# Patient Record
Sex: Female | Born: 1955 | Race: White | Hispanic: No | Marital: Single
Health system: Southern US, Community
[De-identification: ages and names within clinical notes are randomized; demographics above are authoritative.]

## PROBLEM LIST (undated history)

## (undated) DIAGNOSIS — F32A Depression, unspecified: Secondary | ICD-10-CM

## (undated) DIAGNOSIS — R609 Edema, unspecified: Secondary | ICD-10-CM

## (undated) DIAGNOSIS — F329 Major depressive disorder, single episode, unspecified: Secondary | ICD-10-CM

## (undated) DIAGNOSIS — I5022 Chronic systolic (congestive) heart failure: Secondary | ICD-10-CM

## (undated) DIAGNOSIS — J449 Chronic obstructive pulmonary disease, unspecified: Secondary | ICD-10-CM

## (undated) DIAGNOSIS — Z794 Long term (current) use of insulin: Secondary | ICD-10-CM

## (undated) DIAGNOSIS — E119 Type 2 diabetes mellitus without complications: Secondary | ICD-10-CM

## (undated) DIAGNOSIS — IMO0001 Reserved for inherently not codable concepts without codable children: Secondary | ICD-10-CM

## (undated) DIAGNOSIS — G629 Polyneuropathy, unspecified: Secondary | ICD-10-CM

## (undated) DIAGNOSIS — I469 Cardiac arrest, cause unspecified: Secondary | ICD-10-CM

## (undated) DIAGNOSIS — I219 Acute myocardial infarction, unspecified: Secondary | ICD-10-CM

## (undated) DIAGNOSIS — E785 Hyperlipidemia, unspecified: Secondary | ICD-10-CM

## (undated) DIAGNOSIS — K219 Gastro-esophageal reflux disease without esophagitis: Secondary | ICD-10-CM

## (undated) DIAGNOSIS — R42 Dizziness and giddiness: Secondary | ICD-10-CM

## (undated) DIAGNOSIS — I10 Essential (primary) hypertension: Secondary | ICD-10-CM

## (undated) HISTORY — PX: OTHER SURGICAL HISTORY: SHX169

---

## 2017-09-17 DIAGNOSIS — I5022 Chronic systolic (congestive) heart failure: Secondary | ICD-10-CM

## 2017-09-17 HISTORY — DX: Chronic systolic (congestive) heart failure: I50.22

## 2018-12-13 ENCOUNTER — Emergency Department (HOSPITAL_COMMUNITY): Payer: Medicaid Other

## 2018-12-13 ENCOUNTER — Other Ambulatory Visit: Payer: Self-pay

## 2018-12-13 ENCOUNTER — Encounter (HOSPITAL_COMMUNITY): Payer: Self-pay | Admitting: Emergency Medicine

## 2018-12-13 ENCOUNTER — Inpatient Hospital Stay (HOSPITAL_COMMUNITY): Payer: Medicaid Other

## 2018-12-13 DIAGNOSIS — R402112 Coma scale, eyes open, never, at arrival to emergency department: Secondary | ICD-10-CM | POA: Diagnosis present

## 2018-12-13 DIAGNOSIS — IMO0001 Reserved for inherently not codable concepts without codable children: Secondary | ICD-10-CM

## 2018-12-13 DIAGNOSIS — Z515 Encounter for palliative care: Secondary | ICD-10-CM | POA: Diagnosis not present

## 2018-12-13 DIAGNOSIS — Z794 Long term (current) use of insulin: Secondary | ICD-10-CM

## 2018-12-13 DIAGNOSIS — E785 Hyperlipidemia, unspecified: Secondary | ICD-10-CM | POA: Diagnosis present

## 2018-12-13 DIAGNOSIS — R402212 Coma scale, best verbal response, none, at arrival to emergency department: Secondary | ICD-10-CM | POA: Diagnosis present

## 2018-12-13 DIAGNOSIS — I252 Old myocardial infarction: Secondary | ICD-10-CM

## 2018-12-13 DIAGNOSIS — R402332 Coma scale, best motor response, abnormal, at arrival to emergency department: Secondary | ICD-10-CM | POA: Diagnosis present

## 2018-12-13 DIAGNOSIS — Z87891 Personal history of nicotine dependence: Secondary | ICD-10-CM

## 2018-12-13 DIAGNOSIS — J9601 Acute respiratory failure with hypoxia: Secondary | ICD-10-CM | POA: Diagnosis present

## 2018-12-13 DIAGNOSIS — R9401 Abnormal electroencephalogram [EEG]: Secondary | ICD-10-CM | POA: Diagnosis not present

## 2018-12-13 DIAGNOSIS — I5022 Chronic systolic (congestive) heart failure: Secondary | ICD-10-CM | POA: Diagnosis present

## 2018-12-13 DIAGNOSIS — F329 Major depressive disorder, single episode, unspecified: Secondary | ICD-10-CM | POA: Diagnosis present

## 2018-12-13 DIAGNOSIS — I469 Cardiac arrest, cause unspecified: Principal | ICD-10-CM | POA: Diagnosis present

## 2018-12-13 DIAGNOSIS — K219 Gastro-esophageal reflux disease without esophagitis: Secondary | ICD-10-CM | POA: Diagnosis present

## 2018-12-13 DIAGNOSIS — S2243XA Multiple fractures of ribs, bilateral, initial encounter for closed fracture: Secondary | ICD-10-CM | POA: Diagnosis present

## 2018-12-13 DIAGNOSIS — R739 Hyperglycemia, unspecified: Secondary | ICD-10-CM

## 2018-12-13 DIAGNOSIS — I214 Non-ST elevation (NSTEMI) myocardial infarction: Secondary | ICD-10-CM | POA: Diagnosis present

## 2018-12-13 DIAGNOSIS — I5023 Acute on chronic systolic (congestive) heart failure: Secondary | ICD-10-CM | POA: Diagnosis present

## 2018-12-13 DIAGNOSIS — J690 Pneumonitis due to inhalation of food and vomit: Secondary | ICD-10-CM | POA: Diagnosis not present

## 2018-12-13 DIAGNOSIS — N390 Urinary tract infection, site not specified: Secondary | ICD-10-CM | POA: Diagnosis present

## 2018-12-13 DIAGNOSIS — R403 Persistent vegetative state: Secondary | ICD-10-CM | POA: Diagnosis not present

## 2018-12-13 DIAGNOSIS — R34 Anuria and oliguria: Secondary | ICD-10-CM | POA: Diagnosis not present

## 2018-12-13 DIAGNOSIS — Z66 Do not resuscitate: Secondary | ICD-10-CM | POA: Diagnosis not present

## 2018-12-13 DIAGNOSIS — E119 Type 2 diabetes mellitus without complications: Secondary | ICD-10-CM

## 2018-12-13 DIAGNOSIS — J449 Chronic obstructive pulmonary disease, unspecified: Secondary | ICD-10-CM | POA: Diagnosis present

## 2018-12-13 DIAGNOSIS — I2510 Atherosclerotic heart disease of native coronary artery without angina pectoris: Secondary | ICD-10-CM | POA: Diagnosis present

## 2018-12-13 DIAGNOSIS — I110 Hypertensive heart disease with heart failure: Secondary | ICD-10-CM | POA: Diagnosis present

## 2018-12-13 DIAGNOSIS — G931 Anoxic brain damage, not elsewhere classified: Secondary | ICD-10-CM | POA: Diagnosis not present

## 2018-12-13 DIAGNOSIS — N170 Acute kidney failure with tubular necrosis: Secondary | ICD-10-CM | POA: Diagnosis present

## 2018-12-13 DIAGNOSIS — E1110 Type 2 diabetes mellitus with ketoacidosis without coma: Secondary | ICD-10-CM | POA: Diagnosis present

## 2018-12-13 DIAGNOSIS — E871 Hypo-osmolality and hyponatremia: Secondary | ICD-10-CM | POA: Diagnosis present

## 2018-12-13 DIAGNOSIS — Z6841 Body Mass Index (BMI) 40.0 and over, adult: Secondary | ICD-10-CM

## 2018-12-13 DIAGNOSIS — E1140 Type 2 diabetes mellitus with diabetic neuropathy, unspecified: Secondary | ICD-10-CM | POA: Diagnosis present

## 2018-12-13 DIAGNOSIS — E875 Hyperkalemia: Secondary | ICD-10-CM | POA: Diagnosis present

## 2018-12-13 DIAGNOSIS — Z955 Presence of coronary angioplasty implant and graft: Secondary | ICD-10-CM

## 2018-12-13 DIAGNOSIS — I5043 Acute on chronic combined systolic (congestive) and diastolic (congestive) heart failure: Secondary | ICD-10-CM

## 2018-12-13 DIAGNOSIS — E872 Acidosis, unspecified: Secondary | ICD-10-CM

## 2018-12-13 DIAGNOSIS — I472 Ventricular tachycardia: Secondary | ICD-10-CM

## 2018-12-13 DIAGNOSIS — G9341 Metabolic encephalopathy: Secondary | ICD-10-CM

## 2018-12-13 DIAGNOSIS — E876 Hypokalemia: Secondary | ICD-10-CM | POA: Diagnosis not present

## 2018-12-13 DIAGNOSIS — R579 Shock, unspecified: Secondary | ICD-10-CM | POA: Diagnosis present

## 2018-12-13 DIAGNOSIS — E1111 Type 2 diabetes mellitus with ketoacidosis with coma: Secondary | ICD-10-CM

## 2018-12-13 DIAGNOSIS — Z9911 Dependence on respirator [ventilator] status: Secondary | ICD-10-CM

## 2018-12-13 DIAGNOSIS — I447 Left bundle-branch block, unspecified: Secondary | ICD-10-CM | POA: Diagnosis present

## 2018-12-13 DIAGNOSIS — D72829 Elevated white blood cell count, unspecified: Secondary | ICD-10-CM

## 2018-12-13 DIAGNOSIS — J9690 Respiratory failure, unspecified, unspecified whether with hypoxia or hypercapnia: Secondary | ICD-10-CM

## 2018-12-13 DIAGNOSIS — N179 Acute kidney failure, unspecified: Secondary | ICD-10-CM

## 2018-12-13 DIAGNOSIS — I10 Essential (primary) hypertension: Secondary | ICD-10-CM | POA: Diagnosis present

## 2018-12-13 DIAGNOSIS — I429 Cardiomyopathy, unspecified: Secondary | ICD-10-CM | POA: Diagnosis present

## 2018-12-13 DIAGNOSIS — B961 Klebsiella pneumoniae [K. pneumoniae] as the cause of diseases classified elsewhere: Secondary | ICD-10-CM | POA: Diagnosis present

## 2018-12-13 DIAGNOSIS — G253 Myoclonus: Secondary | ICD-10-CM | POA: Diagnosis not present

## 2018-12-13 DIAGNOSIS — Z79899 Other long term (current) drug therapy: Secondary | ICD-10-CM

## 2018-12-13 HISTORY — DX: Chronic obstructive pulmonary disease, unspecified: J44.9

## 2018-12-13 HISTORY — DX: Cardiac arrest, cause unspecified: I46.9

## 2018-12-13 HISTORY — DX: Gastro-esophageal reflux disease without esophagitis: K21.9

## 2018-12-13 HISTORY — DX: Hyperlipidemia, unspecified: E78.5

## 2018-12-13 HISTORY — DX: Acute myocardial infarction, unspecified: I21.9

## 2018-12-13 HISTORY — DX: Type 2 diabetes mellitus without complications: E11.9

## 2018-12-13 HISTORY — DX: Edema, unspecified: R60.9

## 2018-12-13 HISTORY — DX: Major depressive disorder, single episode, unspecified: F32.9

## 2018-12-13 HISTORY — DX: Polyneuropathy, unspecified: G62.9

## 2018-12-13 HISTORY — DX: Long term (current) use of insulin: Z79.4

## 2018-12-13 HISTORY — DX: Reserved for inherently not codable concepts without codable children: IMO0001

## 2018-12-13 HISTORY — DX: Chronic systolic (congestive) heart failure: I50.22

## 2018-12-13 HISTORY — DX: Depression, unspecified: F32.A

## 2018-12-13 HISTORY — DX: Dizziness and giddiness: R42

## 2018-12-13 HISTORY — DX: Essential (primary) hypertension: I10

## 2018-12-13 LAB — BASIC METABOLIC PANEL
Anion gap: 7 (ref 5–15)
BUN: 12 mg/dL (ref 8–23)
CO2: 21 mmol/L — ABNORMAL LOW (ref 22–32)
CREATININE: 0.76 mg/dL (ref 0.44–1.00)
Calcium: 7.4 mg/dL — ABNORMAL LOW (ref 8.9–10.3)
Chloride: 106 mmol/L (ref 98–111)
GFR calc Af Amer: >60 mL/min (ref >60)
GFR calc non Af Amer: >60 mL/min (ref >60)
Glucose, Bld: 292 mg/dL — ABNORMAL HIGH (ref 70–99)
Potassium: 3.3 mmol/L — ABNORMAL LOW (ref 3.5–5.1)
Sodium: 134 mmol/L — ABNORMAL LOW (ref 135–145)

## 2018-12-13 LAB — COMPREHENSIVE METABOLIC PANEL
ALT: 55 U/L — ABNORMAL HIGH (ref 0–44)
ALT: 37 U/L (ref 0–44)
AST: 71 U/L — ABNORMAL HIGH (ref 15–41)
AST: 76 U/L — AB (ref 15–41)
Albumin: 2.9 g/dL — ABNORMAL LOW (ref 3.5–5.0)
Albumin: 2.3 g/dL — ABNORMAL LOW (ref 3.5–5.0)
Alkaline Phosphatase: 127 U/L — ABNORMAL HIGH (ref 38–126)
Alkaline Phosphatase: 70 U/L (ref 38–126)
Anion gap: 22 — ABNORMAL HIGH (ref 5–15)
Anion gap: 7 (ref 5–15)
BILIRUBIN TOTAL: 0.8 mg/dL (ref 0.3–1.2)
BILIRUBIN TOTAL: 0.6 mg/dL (ref 0.3–1.2)
BUN: 18 mg/dL (ref 8–23)
BUN: 13 mg/dL (ref 8–23)
CALCIUM: 8.6 mg/dL — AB (ref 8.9–10.3)
CO2: 13 mmol/L — ABNORMAL LOW (ref 22–32)
CO2: 19 mmol/L — ABNORMAL LOW (ref 22–32)
Calcium: 7.3 mg/dL — ABNORMAL LOW (ref 8.9–10.3)
Chloride: 80 mmol/L — ABNORMAL LOW (ref 98–111)
Chloride: 105 mmol/L (ref 98–111)
Creatinine, Ser: 1.78 mg/dL — ABNORMAL HIGH (ref 0.44–1.00)
Creatinine, Ser: 0.66 mg/dL (ref 0.44–1.00)
GFR calc Af Amer: 34 mL/min — ABNORMAL LOW (ref >60)
GFR calc Af Amer: >60 mL/min (ref >60)
GFR calc non Af Amer: 30 mL/min — ABNORMAL LOW (ref >60)
GFR calc non Af Amer: >60 mL/min (ref >60)
Glucose, Bld: 1112 mg/dL (ref 70–99)
Glucose, Bld: 208 mg/dL — ABNORMAL HIGH (ref 70–99)
Potassium: 7.3 mmol/L (ref 3.5–5.1)
Potassium: 3.3 mmol/L — ABNORMAL LOW (ref 3.5–5.1)
Sodium: 115 mmol/L — CL (ref 135–145)
Sodium: 131 mmol/L — ABNORMAL LOW (ref 135–145)
Total Protein: 7.2 g/dL (ref 6.5–8.1)
Total Protein: 5.7 g/dL — ABNORMAL LOW (ref 6.5–8.1)

## 2018-12-13 LAB — GLUCOSE, CAPILLARY
GLUCOSE-CAPILLARY: 295 mg/dL — AB (ref 70–99)
Glucose-Capillary: 152 mg/dL — ABNORMAL HIGH (ref 70–99)
Glucose-Capillary: 168 mg/dL — ABNORMAL HIGH (ref 70–99)
Glucose-Capillary: 186 mg/dL — ABNORMAL HIGH (ref 70–99)
Glucose-Capillary: 204 mg/dL — ABNORMAL HIGH (ref 70–99)
Glucose-Capillary: 220 mg/dL — ABNORMAL HIGH (ref 70–99)
Glucose-Capillary: 288 mg/dL — ABNORMAL HIGH (ref 70–99)
Glucose-Capillary: 463 mg/dL — ABNORMAL HIGH (ref 70–99)
Glucose-Capillary: 486 mg/dL — ABNORMAL HIGH (ref 70–99)
Glucose-Capillary: 540 mg/dL (ref 70–99)

## 2018-12-13 LAB — CBC WITH DIFFERENTIAL/PLATELET
Abs Immature Granulocytes: 1.96 10*3/uL — ABNORMAL HIGH (ref 0.00–0.07)
Abs Immature Granulocytes: 0.44 10*3/uL — ABNORMAL HIGH (ref 0.00–0.07)
Basophils Absolute: 0.1 10*3/uL (ref 0.0–0.1)
Basophils Absolute: 0 10*3/uL (ref 0.0–0.1)
Basophils Relative: 1 %
Basophils Relative: 0 %
Eosinophils Absolute: 0.1 10*3/uL (ref 0.0–0.5)
Eosinophils Absolute: 0 10*3/uL (ref 0.0–0.5)
Eosinophils Relative: 0 %
Eosinophils Relative: 0 %
HCT: 45.6 % (ref 36.0–46.0)
HCT: 38.1 % (ref 36.0–46.0)
Hemoglobin: 12.3 g/dL (ref 12.0–15.0)
Hemoglobin: 11.5 g/dL — ABNORMAL LOW (ref 12.0–15.0)
IMMATURE GRANULOCYTES: 3 %
Immature Granulocytes: 8 %
LYMPHS ABS: 3 10*3/uL (ref 0.7–4.0)
Lymphocytes Relative: 12 %
Lymphocytes Relative: 6 %
Lymphs Abs: 0.8 10*3/uL (ref 0.7–4.0)
MCH: 25.1 pg — ABNORMAL LOW (ref 26.0–34.0)
MCH: 25.2 pg — ABNORMAL LOW (ref 26.0–34.0)
MCHC: 27 g/dL — ABNORMAL LOW (ref 30.0–36.0)
MCHC: 30.2 g/dL (ref 30.0–36.0)
MCV: 92.9 fL (ref 80.0–100.0)
MCV: 83.6 fL (ref 80.0–100.0)
MONOS PCT: 3 %
Monocytes Absolute: 1.1 10*3/uL — ABNORMAL HIGH (ref 0.1–1.0)
Monocytes Absolute: 0.4 10*3/uL (ref 0.1–1.0)
Monocytes Relative: 5 %
NEUTROS ABS: 11.9 10*3/uL — AB (ref 1.7–7.7)
Neutro Abs: 17.7 10*3/uL — ABNORMAL HIGH (ref 1.7–7.7)
Neutrophils Relative %: 74 %
Neutrophils Relative %: 88 %
Platelets: 376 10*3/uL (ref 150–400)
Platelets: 191 10*3/uL (ref 150–400)
RBC: 4.91 MIL/uL (ref 3.87–5.11)
RBC: 4.56 MIL/uL (ref 3.87–5.11)
RDW: 15.1 % (ref 11.5–15.5)
RDW: 14.4 % (ref 11.5–15.5)
WBC: 23.8 10*3/uL — ABNORMAL HIGH (ref 4.0–10.5)
WBC: 13.6 10*3/uL — ABNORMAL HIGH (ref 4.0–10.5)
nRBC: 0.1 % (ref 0.0–0.2)
nRBC: 0 % (ref 0.0–0.2)

## 2018-12-13 LAB — POCT I-STAT 7, (LYTES, BLD GAS, ICA,H+H)
Acid-base deficit: 14 mmol/L — ABNORMAL HIGH (ref 0.0–2.0)
Acid-base deficit: 7 mmol/L — ABNORMAL HIGH (ref 0.0–2.0)
BICARBONATE: 21.5 mmol/L (ref 20.0–28.0)
Bicarbonate: 15.4 mmol/L — ABNORMAL LOW (ref 20.0–28.0)
CALCIUM ION: 1.12 mmol/L — AB (ref 1.15–1.40)
Calcium, Ion: 1.14 mmol/L — ABNORMAL LOW (ref 1.15–1.40)
HCT: 36 % (ref 36.0–46.0)
HEMATOCRIT: 41 % (ref 36.0–46.0)
Hemoglobin: 13.9 g/dL (ref 12.0–15.0)
Hemoglobin: 12.2 g/dL (ref 12.0–15.0)
O2 SAT: 100 %
O2 Saturation: 100 %
PH ART: 7.229 — AB (ref 7.350–7.450)
PO2 ART: 234 mmHg — AB (ref 83.0–108.0)
PO2 ART: 329 mmHg — AB (ref 83.0–108.0)
Patient temperature: 98.6
Patient temperature: 34.4
Potassium: 6.4 mmol/L (ref 3.5–5.1)
Potassium: 3.6 mmol/L (ref 3.5–5.1)
Sodium: 123 mmol/L — ABNORMAL LOW (ref 135–145)
Sodium: 136 mmol/L (ref 135–145)
TCO2: 17 mmol/L — ABNORMAL LOW (ref 22–32)
TCO2: 23 mmol/L (ref 22–32)
pCO2 arterial: 47.4 mmHg (ref 32.0–48.0)
pCO2 arterial: 49.8 mmHg — ABNORMAL HIGH (ref 32.0–48.0)
pH, Arterial: 7.12 — CL (ref 7.350–7.450)

## 2018-12-13 LAB — ECHOCARDIOGRAM LIMITED
Height: 61 in
Weight: 3360 oz

## 2018-12-13 LAB — URINALYSIS, ROUTINE W REFLEX MICROSCOPIC
Bilirubin Urine: NEGATIVE
Glucose, UA: >=500 mg/dL — AB
Ketones, ur: NEGATIVE mg/dL
LEUKOCYTE UA: NEGATIVE
Nitrite: NEGATIVE
Protein, ur: 30 mg/dL — AB
Specific Gravity, Urine: 1.022 (ref 1.005–1.030)
pH: 6 (ref 5.0–8.0)

## 2018-12-13 LAB — CBG MONITORING, ED
Glucose-Capillary: >600 mg/dL (ref 70–99)
Glucose-Capillary: >600 mg/dL (ref 70–99)
Glucose-Capillary: 589 mg/dL (ref 70–99)

## 2018-12-13 LAB — MAGNESIUM
Magnesium: 2 mg/dL (ref 1.7–2.4)
Magnesium: 0.5 mg/dL — CL (ref 1.7–2.4)

## 2018-12-13 LAB — PROTIME-INR
INR: 1.3 — ABNORMAL HIGH (ref 0.8–1.2)
Prothrombin Time: 15.8 seconds — ABNORMAL HIGH (ref 11.4–15.2)

## 2018-12-13 LAB — PROCALCITONIN: Procalcitonin: <0.1 ng/mL

## 2018-12-13 LAB — LACTIC ACID, PLASMA
Lactic Acid, Venous: 6.9 mmol/L (ref 0.5–1.9)
Lactic Acid, Venous: 2.5 mmol/L (ref 0.5–1.9)

## 2018-12-13 LAB — BETA-HYDROXYBUTYRIC ACID: Beta-Hydroxybutyric Acid: 0.62 mmol/L — ABNORMAL HIGH (ref 0.05–0.27)

## 2018-12-13 LAB — MRSA PCR SCREENING: MRSA by PCR: NEGATIVE

## 2018-12-13 LAB — STREP PNEUMONIAE URINARY ANTIGEN: Strep Pneumo Urinary Antigen: NEGATIVE

## 2018-12-13 LAB — HEMOGLOBIN A1C
Hgb A1c MFr Bld: 12.5 % — ABNORMAL HIGH (ref 4.8–5.6)
Mean Plasma Glucose: 312.05 mg/dL

## 2018-12-13 LAB — PHOSPHORUS
Phosphorus: 10.7 mg/dL — ABNORMAL HIGH (ref 2.5–4.6)
Phosphorus: 3.1 mg/dL (ref 2.5–4.6)

## 2018-12-13 LAB — TROPONIN I
Troponin I: 3.42 ng/mL (ref <0.03)
Troponin I: 1.73 ng/mL (ref <0.03)
Troponin I: 4.86 ng/mL (ref <0.03)

## 2018-12-13 LAB — APTT: aPTT: 33 seconds (ref 24–36)

## 2018-12-13 MED ORDER — VANCOMYCIN HCL 10 G IV SOLR
2000.0000 mg | Freq: Once | INTRAVENOUS | Status: AC
  Administered 2018-12-13: 2000 mg via INTRAVENOUS
  Filled 2018-12-13: qty 2000

## 2018-12-13 MED ORDER — FENTANYL 2500MCG IN NS 250ML (10MCG/ML) PREMIX INFUSION
25.0000 ug/h | INTRAVENOUS | Status: DC
  Administered 2018-12-13: 100 ug/h via INTRAVENOUS
  Administered 2018-12-14: 200 ug/h via INTRAVENOUS
  Administered 2018-12-14: 150 ug/h via INTRAVENOUS
  Administered 2018-12-15: 75 ug/h via INTRAVENOUS
  Administered 2018-12-16: 200 ug/h via INTRAVENOUS
  Administered 2018-12-16: 225 ug/h via INTRAVENOUS
  Administered 2018-12-17: 12:00:00 200 ug/h via INTRAVENOUS
  Filled 2018-12-13 (×7): qty 250

## 2018-12-13 MED ORDER — VITAL HIGH PROTEIN PO LIQD: 1000.0000 mL | ORAL | Status: DC

## 2018-12-13 MED ORDER — NOREPINEPHRINE 4 MG/250ML-% IV SOLN
0.0000 ug/min | INTRAVENOUS | Status: DC
  Administered 2018-12-13: 2.5 ug/min via INTRAVENOUS

## 2018-12-13 MED ORDER — INSULIN REGULAR(HUMAN) IN NACL 100-0.9 UT/100ML-% IV SOLN: INTRAVENOUS | Status: DC

## 2018-12-13 MED ORDER — CHLORHEXIDINE GLUCONATE CLOTH 2 % EX PADS
6.0000 | MEDICATED_PAD | Freq: Every day | CUTANEOUS | Status: DC
  Administered 2018-12-13 – 2018-12-18 (×5): 6 via TOPICAL

## 2018-12-13 MED ORDER — NOREPINEPHRINE BITARTRATE 1 MG/ML IV SOLN
INTRAVENOUS | Status: AC | PRN
  Administered 2018-12-13: 5 ug via INTRAVENOUS

## 2018-12-13 MED ORDER — PROPOFOL 1000 MG/100ML IV EMUL
INTRAVENOUS | Status: AC
  Filled 2018-12-13: qty 100

## 2018-12-13 MED ORDER — SODIUM CHLORIDE 0.9 % IV SOLN
INTRAVENOUS | Status: DC
  Administered 2018-12-13: 15:00:00 via INTRAVENOUS

## 2018-12-13 MED ORDER — VITAL HIGH PROTEIN PO LIQD
1000.0000 mL | ORAL | Status: DC
  Administered 2018-12-14 – 2018-12-16 (×4): 1000 mL

## 2018-12-13 MED ORDER — DEXTROSE-NACL 5-0.45 % IV SOLN: INTRAVENOUS | Status: DC

## 2018-12-13 MED ORDER — ASPIRIN 81 MG PO CHEW
81.0000 mg | CHEWABLE_TABLET | Freq: Every day | ORAL | Status: DC
  Administered 2018-12-14: 81 mg via ORAL
  Filled 2018-12-13: qty 1

## 2018-12-13 MED ORDER — ASPIRIN 300 MG RE SUPP: 300.0000 mg | RECTAL | Status: DC

## 2018-12-13 MED ORDER — PIPERACILLIN-TAZOBACTAM 3.375 G IVPB 30 MIN
3.3750 g | Freq: Once | INTRAVENOUS | Status: AC
  Administered 2018-12-13: 3.375 g via INTRAVENOUS
  Filled 2018-12-13: qty 50

## 2018-12-13 MED ORDER — PIPERACILLIN-TAZOBACTAM 3.375 G IVPB
3.3750 g | Freq: Three times a day (TID) | INTRAVENOUS | Status: AC
  Administered 2018-12-13 – 2018-12-19 (×18): 3.375 g via INTRAVENOUS
  Filled 2018-12-13 (×17): qty 50

## 2018-12-13 MED ORDER — FENTANYL BOLUS VIA INFUSION
50.0000 ug | INTRAVENOUS | Status: DC | PRN
  Administered 2018-12-13 – 2018-12-18 (×23): 50 ug via INTRAVENOUS
  Filled 2018-12-13: qty 50

## 2018-12-13 MED ORDER — ROCURONIUM BROMIDE 50 MG/5ML IV SOLN
INTRAVENOUS | Status: AC | PRN
  Administered 2018-12-13: 90 mg via INTRAVENOUS

## 2018-12-13 MED ORDER — CHLORHEXIDINE GLUCONATE 0.12% ORAL RINSE (MEDLINE KIT)
15.0000 mL | Freq: Two times a day (BID) | OROMUCOSAL | Status: DC
  Administered 2018-12-13 – 2018-12-19 (×13): 15 mL via OROMUCOSAL

## 2018-12-13 MED ORDER — SODIUM CHLORIDE 0.9 % IV SOLN
1.0000 g | Freq: Once | INTRAVENOUS | Status: AC
  Administered 2018-12-13: 1 g via INTRAVENOUS
  Filled 2018-12-13: qty 10

## 2018-12-13 MED ORDER — SODIUM CHLORIDE 0.9 % IV SOLN
INTRAVENOUS | Status: AC | PRN
  Administered 2018-12-13: 1000 mL via INTRAVENOUS

## 2018-12-13 MED ORDER — STERILE WATER FOR INJECTION IV SOLN
INTRAVENOUS | Status: DC
  Administered 2018-12-13: 11:00:00 via INTRAVENOUS
  Filled 2018-12-13 (×4): qty 850

## 2018-12-13 MED ORDER — DEXTROSE 5 % IV SOLN
INTRAVENOUS | Status: AC | PRN
  Administered 2018-12-13: 150 mg via INTRAVENOUS

## 2018-12-13 MED ORDER — ACETAMINOPHEN 325 MG PO TABS
650.0000 mg | ORAL_TABLET | ORAL | Status: DC | PRN
  Administered 2018-12-14 – 2018-12-15 (×2): 650 mg via ORAL
  Filled 2018-12-13 (×3): qty 2

## 2018-12-13 MED ORDER — INSULIN REGULAR(HUMAN) IN NACL 100-0.9 UT/100ML-% IV SOLN
INTRAVENOUS | Status: DC
  Administered 2018-12-13: 5.4 [IU]/h via INTRAVENOUS
  Administered 2018-12-13: 24.2 [IU]/h via INTRAVENOUS
  Administered 2018-12-14: 8.3 [IU]/h via INTRAVENOUS
  Filled 2018-12-13 (×4): qty 100

## 2018-12-13 MED ORDER — MIDAZOLAM HCL 2 MG/2ML IJ SOLN
2.0000 mg | Freq: Once | INTRAMUSCULAR | Status: AC
  Administered 2018-12-13: 2 mg via INTRAVENOUS
  Filled 2018-12-13: qty 2

## 2018-12-13 MED ORDER — INSULIN ASPART 100 UNIT/ML ~~LOC~~ SOLN
10.0000 [IU] | Freq: Once | SUBCUTANEOUS | Status: AC
  Administered 2018-12-13: 10 [IU] via INTRAVENOUS

## 2018-12-13 MED ORDER — VANCOMYCIN HCL 10 G IV SOLR
1250.0000 mg | INTRAVENOUS | Status: DC
  Filled 2018-12-13: qty 1250

## 2018-12-13 MED ORDER — PROPOFOL 1000 MG/100ML IV EMUL
5.0000 ug/kg/min | INTRAVENOUS | Status: DC
  Administered 2018-12-13: 5 ug/kg/min via INTRAVENOUS
  Administered 2018-12-13: 50 ug/kg/min via INTRAVENOUS
  Filled 2018-12-13: qty 100

## 2018-12-13 MED ORDER — ORAL CARE MOUTH RINSE
15.0000 mL | OROMUCOSAL | Status: DC
  Administered 2018-12-13 – 2018-12-19 (×58): 15 mL via OROMUCOSAL

## 2018-12-13 MED ORDER — ONDANSETRON HCL 4 MG/2ML IJ SOLN: 4.0000 mg | Freq: Four times a day (QID) | INTRAMUSCULAR | Status: DC | PRN

## 2018-12-13 MED ORDER — FAMOTIDINE 20 MG IN NS 100 ML IVPB
20.0000 mg | Freq: Two times a day (BID) | INTRAVENOUS | Status: DC
  Administered 2018-12-13 – 2018-12-14 (×2): 20 mg via INTRAVENOUS
  Filled 2018-12-13 (×4): qty 100

## 2018-12-13 MED ORDER — PRO-STAT SUGAR FREE PO LIQD: 30.0000 mL | Freq: Two times a day (BID) | ORAL | Status: DC

## 2018-12-13 MED ORDER — FENTANYL CITRATE (PF) 100 MCG/2ML IJ SOLN
50.0000 ug | Freq: Once | INTRAMUSCULAR | Status: AC
  Administered 2018-12-13: 50 ug via INTRAVENOUS

## 2018-12-13 MED ORDER — DEXTROSE-NACL 5-0.45 % IV SOLN
INTRAVENOUS | Status: AC
  Administered 2018-12-13: 12:00:00 via INTRAVENOUS
  Administered 2018-12-13: 100 mL/h via INTRAVENOUS

## 2018-12-13 MED ORDER — ETOMIDATE 2 MG/ML IV SOLN
INTRAVENOUS | Status: AC | PRN
  Administered 2018-12-13: 20 mg via INTRAVENOUS

## 2018-12-13 MED ORDER — HEPARIN SODIUM (PORCINE) 5000 UNIT/ML IJ SOLN
5000.0000 [IU] | Freq: Three times a day (TID) | INTRAMUSCULAR | Status: DC
  Administered 2018-12-13 – 2018-12-17 (×13): 5000 [IU] via SUBCUTANEOUS
  Filled 2018-12-13 (×13): qty 1

## 2018-12-13 MED ORDER — MAGNESIUM SULFATE 4 GM/100ML IV SOLN
4.0000 g | Freq: Once | INTRAVENOUS | Status: AC
  Administered 2018-12-13: 4 g via INTRAVENOUS
  Filled 2018-12-13: qty 100

## 2018-12-13 MED ORDER — ASPIRIN 325 MG PO TABS
325.0000 mg | ORAL_TABLET | Freq: Once | ORAL | Status: AC
  Administered 2018-12-13: 325 mg via ORAL
  Filled 2018-12-13: qty 1

## 2018-12-13 MED ORDER — SODIUM CHLORIDE 0.9 % IV BOLUS
1000.0000 mL | Freq: Once | INTRAVENOUS | Status: AC
  Administered 2018-12-13: 1000 mL via INTRAVENOUS

## 2018-12-13 NOTE — Consult Note (Addendum)
Cardiology Consultation:   Patient ID: Nicole Lee; [id number]; 1956-05-10   Admit date: 12-17-2018 Date of Consult: 12-17-2018  Primary Care Provider: No primary care provider on file. Primary Cardiologist: No primary care provider on file. New here Primary Electrophysiologist:  None   Patient Profile:   Nicole Lee is a 63 y.o. female with a hx of MI, COPD, DM, HTN, HLD, obesity, GERD, depression, who is being seen today for the evaluation of cardiac arrest at the request of Dr Ayesha Rumpf.  History of Present Illness:   Nicole Lee is intubated and sedated. No family is present. Information was obtained from records, chart notes and staff.  Nicole Lee was c/o SOB and going to BR when she collapsed. CPR initiated by family. EMS was called, initial rhythm PEA. CPR started at 07:37 per famly, CPR started at 7:45 per EMS. AED by FD advised shock, done x 3. ROSC at 07:55. Pt had Epi x 3 and a King airway placed.   Initial EMS ECG is SR, PVCs, HR 84, no old available. Pt then in wide complex tachycardia (VT vs ST with LBB aberrancy), hr 131. Initial ER ECG is ST, HR 149. Pt currently in ST.  Pt intubated and on Precedex.    Family advised that pt had been weaker than normal, no energy. Had breathing treatment this am, prior to arrest. Was coughing, but that is normal for her. She became very SOB, took another breathing treatment, and said she felt better but was "purple". No reports of chest pain, just the SOB.    Past Medical History:  Diagnosis Date  . Cardiac arrest (HCC) December 17, 2018  . COPD (chronic obstructive pulmonary disease) (HCC)   . Depression   . Diabetes mellitus without complication (HCC)   . Fluid retention   . GERD (gastroesophageal reflux disease)   . Heart attack (HCC)    x7  . Hyperlipidemia   . Hypertension   . Neuropathy   . Vertigo     Past Surgical History:  Procedure Laterality Date  . Cardiac Stents     x11     Prior to Admission  medications   Not on File    Inpatient Medications: Scheduled Meds: . aspirin  300 mg Rectal NOW  . insulin aspart  10 Units Intravenous Once   Continuous Infusions: . calcium gluconate 1 GM IVPB 1 g (Dec 17, 2018 1103)  . dextrose 5 % and 0.45% NaCl    . insulin    . norepinephrine (LEVOPHED) Adult infusion 2.5 mcg/min (12-17-2018 0919)  . piperacillin-tazobactam 3.375 g (12-17-18 1112)  . piperacillin-tazobactam (ZOSYN)  IV    . propofol (DIPRIVAN) infusion Stopped (December 17, 2018 1106)  .  sodium bicarbonate (isotonic) infusion in sterile water    . [START ON 12/15/2018] vancomycin    . vancomycin     PRN Meds:   Allergies:   No Known Allergies  NO OTHER INFORMATION IS AVAILABLE DUE TO PT CONDITION. Social History:   Social History   Socioeconomic History  . Marital status: Not on file    Spouse name: Not on file  . Number of children: Not on file  . Years of education: Not on file  . Highest education level: Not on file  Occupational History  . Not on file  Social Needs  . Financial resource strain: Not on file  . Food insecurity:    Worry: Not on file    Inability: Not on file  . Transportation needs:    Medical: Not  on file    Non-medical: Not on file  Tobacco Use  . Smoking status: Not on file  Substance and Sexual Activity  . Alcohol use: Not on file  . Drug use: Not on file  . Sexual activity: Not on file  Lifestyle  . Physical activity:    Days per week: Not on file    Minutes per session: Not on file  . Stress: Not on file  Relationships  . Social connections:    Talks on phone: Not on file    Gets together: Not on file    Attends religious service: Not on file    Active member of club or organization: Not on file    Attends meetings of clubs or organizations: Not on file    Relationship status: Not on file  . Intimate partner violence:    Fear of current or ex partner: Not on file    Emotionally abused: Not on file    Physically abused: Not on file     Forced sexual activity: Not on file  Other Topics Concern  . Not on file  Social History Narrative  . Not on file    Family History:   No family history on file. Family Status:  No family status information on file.    ROS:  Please see the history of present illness.  All other ROS reviewed and negative.     Physical Exam/Data:   Vitals:   11/25/2018 0950 12/04/2018 1000 12/06/2018 1010 11/27/2018 1020  BP: 127/72 139/86 138/79 (!) 153/75  Pulse: 73 78 79 83  Resp: (!) 22 (!) 22 (!) 22 (!) 22  SpO2: 100% 100% 100% 100%  Weight:      Height:       No intake or output data in the 24 hours ending 12/01/2018 1113 Filed Weights   12/01/2018 0852  Weight: 95.3 kg   Body mass index is 39.68 kg/m.  General:  Well nourished, obese female, intubated and sedated HEENT: normal Lymph: no adenopathy Neck: no JVD seen, difficult to assess 2nd body habitus. Endocrine:  No thryomegaly Vascular: No carotid bruits; 4/4 extremity pulses 2+, without bruits  Cardiac:  normal S1, S2; RRR; no murmur  Lungs:  +rhonchi, no wheezing, bilaterally Abd: soft, nontender, no hepatomegaly  Ext: no edema Musculoskeletal:  No deformities, BUE and BLE strength not tested Skin: cool and dry  Neuro:  Not able to assess  EKG:  The EKG was personally reviewed and demonstrates:   EMS 8:03, SR, HR 84, LBBB EMS 8:23, SR, HR 131, LBBB ER 8:38, ST, HR 149, QRS 244, K+ 7.3 8:46, SR vs Atrial rhythm, hr 61, QRS 180  8:53, SR , Long PR at 326 Nicole, QRS 185  Telemetry:  Telemetry was personally reviewed and demonstrates:  SR, ST  Relevant CV Studies:  None available for review  Laboratory Data:  Chemistry Recent Labs  Lab 12/10/2018 0843 11/29/2018 1036  NA 115* 123*  K 7.3* 6.4*  CL 80*  --   CO2 13*  --   GLUCOSE 1,112*  --   BUN 18  --   CREATININE 1.78*  --   CALCIUM 8.6*  --   GFRNONAA 30*  --   GFRAA 34*  --   ANIONGAP 22*  --     Lab Results  Component Value Date   ALT 55 (H) 12/01/2018    AST 71 (H) 12/12/2018   ALKPHOS 127 (H) 12/05/2018   BILITOT 0.8 11/22/2018  Hematology Recent Labs  Lab 12/09/2018 0843 11/16/2018 1036  WBC 23.8*  --   RBC 4.91  --   HGB 12.3 13.9  HCT 45.6 41.0  MCV 92.9  --   MCH 25.1*  --   MCHC 27.0*  --   RDW 15.1  --   PLT 376  --    Cardiac EnzymesNo results for input(s): TROPONINI in the last 168 hours. No results for input(s): TROPIPOC in the last 168 hours.  BNPNo results for input(s): BNP, PROBNP in the last 168 hours.  DDimer No results for input(s): DDIMER in the last 168 hours. TSH: No results found for: TSH Lipids:No results found for: CHOL, HDL, LDLCALC, LDLDIRECT, TRIG, CHOLHDL HgbA1c:No results found for: HGBA1C Magnesium:  Magnesium  Date Value Ref Range Status  12/04/2018 2.0 1.7 - 2.4 mg/dL Final    Comment:    Performed at [facility], [address], [place], [place] [address]     Radiology/Studies:  Dg Chest Portable 1 View  Result Date: 11/28/2018 CLINICAL DATA:  Cardiac arrest and status post intubation. EXAM: PORTABLE CHEST 1 VIEW COMPARISON:  None. FINDINGS: The heart is moderately enlarged. Endotracheal tube tip is approximately at the carina. Gastric decompression tube extends below the diaphragm. Lungs demonstrate bibasilar atelectasis, right greater than left. No overt pulmonary edema, pleural fluid or pneumothorax identified. There are multiple rib fractures visible by portable chest x-ray including probable left second, right fourth, right fifth, right sixth and right seventh fractures. IMPRESSION: 1. Endotracheal tube tip likely lies at the carina. There may be benefit in retracting the tube by 1-3 cm. 2. Multiple rib fractures visible by portable chest x-ray, right greater than left. No obvious pneumothorax. 3. Cardiac enlargement and bibasilar atelectasis, right greater than left. Electronically Signed   By: Irish Lack M.D.   On: 12/06/2018 09:23   Dg Abd Portable 1 View  Result Date: 11/30/2018  CLINICAL DATA:  Tube placement. EXAM: PORTABLE ABDOMEN - 1 VIEW COMPARISON:  Chest radiograph of same date. FINDINGS: Nasogastric terminates at the body of the stomach. Grossly normal bowel gas pattern, without gross free intraperitoneal air. IMPRESSION: Nasogastric tube terminating at the body of the stomach. Electronically Signed   By: Jeronimo Greaves M.D.   On: 12/04/2018 09:18    Assessment and Plan:   1. Cardiac arrest:  - pt w/ hx CAD, but no reports of CP, just SOB this am - gluc >1000, K+ 7.3 on admit - pt may have been treated at [facility] for cardiac issues in the past, will call them. - no need for urgent cath, suspect LBBB is old. - shocked x 3 by AED, but with wide QRS and tachycardia, may not have had VT/VF.  - follow, cycle ez, check echo, get records. - as pt makes meaningful recovery, decide on cath.   Otherwise, per CCM Principal Problem:   Cardiac arrest [facility])   For questions or updates, please contact [facility] Please consult [email or web address] for contact info under Cardiology/STEMI.   Signed, Theodore Demark, PA-C  12/09/2018 11:13 AM  I have seen, examined the patient, and reviewed the above assessment and plan.  Changes to above are made where necessary.  On exam, very ill appearing,  Unresponsive, intubated.  RRR.  I have reviewed ekgs at length.  She has sinus with LBBB.  There was a wide complex LBB tachycardia, which may have been aberrancy.  Her initial presentation appears to be PEA/ respiratory failure.  She has profound  electrolyte imbalance currently.  She is too ill to consider cath/ cardiology intervention.  PCCM has been consulted for management.  She is quite ill.  Her prognosis is very poor.  Ultimately, palliative measures may be required.  Cardiology to follow.  Critical care time was exclusive of separate billable procedures and treating other patients.  Critial care time was spent personally by me (independant of midlevel providers) on the  following activities: development of treatment plan with ED physician as well as nursing, evaluation of patients response to treatment, examining patient,   ordering/ reviewing treatments/ interventions, lab studies, radiographic studies, pulse ox, and re-evaluation of patients condition.     The patient is critically ill with multiple organ systems failure and requires high complexity decision making for assessment and support, frequent evaluation and titration of therapies, application of advanced monitoring technologies and extensive interpretation of databases.   Critical care was necessary to treat or prevent immintent or life-threatening deterioration.  Total CCT spent directly with the patient today is 35 minutes  Hillis Range MD, [facility] 12/12/2018 11:45 AM

## 2018-12-13 NOTE — ED Provider Notes (Signed)
[facility] EMERGENCY DEPARTMENT Provider Note   CSN: [id number] Arrival date & time: 12/15/2018  6389    History   Chief Complaint Chief Complaint  Patient presents with   Cardiac Arrest    HPI Nicole Lee is a 63 y.o. female.     HPI   63 year old female with a history of COPD, diabetes, hypertension, hyperlipidemia, coronary artery disease with history of multiple cardiac stents, who presents post cardiac arrest, with EMS initially called out for shortness of breath, witnessed arrest by family, and shockable rhythm for fire department.  Per report, family had called due to patient's shortness of breath, and prior to fire department and EMS arriving, she attempted to ambulate to the bathroom and collapsed.  Bystander CPR was started immediately.  When the fire department arrived, the AED advised shocks, and she received 3 defibrillation prior to EMS arrival.  Per EMS, patient was in PEA, was given epinephrine x3 via IO, and placed a King airway.  She had [place] at [employer].  With total estimated downtime of 18 minutes.  No other known recent history per EMS from family--they did not report other concern for fever/cough, and she had more sudden onset of symptoms this AM.   York Spaniel she has been weaker than normal, but otherwise everything has been ok. Said she had no energy.  This AM she was taking her breathing treatment, started coughing, coughed up some mucus. Has COPD, for her to cough is normal.  Thought maybe she did have a URI.  Lives with son, said short of breath, wheezing, panic.  No known fevers.  Started to calm down, took breathing treatment, was changing colors, said breathing treatment working and she felt better but looked purple. When she walked to bedroom, and laid down, she started to feel short of breath, very fast breathing. Said call ambulance.  Son started CPR, not sure if it was right away. Everything happened so quickly. No chest pain.  Can't catch  breath, said didn't feel like heart attack.  Told her son yesterday she was out of her metformin and was supposed to call it in.  She didn't have medicine. No known COVID positive contacts.   After further dicussion, son says had seen PCP, used [facility]-was told she may have some type of "bacterial infection" in her body for which she was given antibiotics. Was not hospitalized or given IV medications. Does not think she had cough or pneumonia.  Also reports she has been seen at [facility] for problems with potassium and salt.   Past Medical History:  Diagnosis Date   Cardiac arrest (HCC) 12/05/2018   COPD (chronic obstructive pulmonary disease) (HCC)    Depression    Diabetes mellitus without complication (HCC)    Fluid retention    GERD (gastroesophageal reflux disease)    Heart attack (HCC)    x7   Hyperlipidemia    Hypertension    Neuropathy    Vertigo     Patient Active Problem List   Diagnosis Date Noted   Cardiac arrest (HCC) 11/23/2018    Past Surgical History:  Procedure Laterality Date   Cardiac Stents     x11     OB History   No obstetric history on file.      Home Medications    Prior to Admission medications   Not on File    Family History No family history on file.  Social History Social History   Tobacco Use  Smoking status: Not on file  Substance Use Topics   Alcohol use: Not on file   Drug use: Not on file     Allergies   Patient has no known allergies.   Review of Systems Review of Systems  Constitutional: Negative for fever.  Respiratory: Positive for shortness of breath.      Physical Exam Updated Vital Signs BP 131/73    Pulse 90    Resp (!) 22    Ht  (1.549 m)    Wt 95.3 kg    SpO2 100%    BMI 39.68 kg/m   Physical Exam Vitals signs and nursing note reviewed.  Constitutional:      General: She is not in acute distress.    Appearance: She is well-developed. She is ill-appearing. She is not  diaphoretic.  HENT:     Head: Normocephalic and atraumatic.  Eyes:     Conjunctiva/sclera: Conjunctivae normal.  Neck:     Musculoskeletal: Normal range of motion.  Cardiovascular:     Rate and Rhythm: Regular rhythm. Tachycardia present.     Heart sounds: Normal heart sounds. No murmur. No friction rub. No gallop.   Pulmonary:     Effort: No respiratory distress.     Breath sounds: Normal breath sounds.     Comments: LMA, bagging on arrival  Abdominal:     General: There is no distension.     Palpations: Abdomen is soft.     Tenderness: There is no abdominal tenderness. There is no guarding.  Musculoskeletal:        General: No tenderness.  Skin:    General: Skin is warm and dry.     Findings: Rash (erythema under breasts) present. No erythema.  Neurological:     Mental Status: She is unresponsive.     GCS: GCS eye subscore is 1. GCS verbal subscore is 1. GCS motor subscore is 3.     Comments: Decorticate posturing to pain      ED Treatments / Results  Labs (all labs ordered are listed, but only abnormal results are displayed) Labs Reviewed  PROTIME-INR - Abnormal; Notable for the following components:      Result Value   Prothrombin Time 15.8 (*)    INR 1.3 (*)    All other components within normal limits  URINALYSIS, ROUTINE W REFLEX MICROSCOPIC - Abnormal; Notable for the following components:   Glucose, UA >=500 (*)    Hgb urine dipstick MODERATE (*)    Protein, ur 30 (*)    Bacteria, UA RARE (*)    All other components within normal limits  CBC WITH DIFFERENTIAL/PLATELET - Abnormal; Notable for the following components:   WBC 23.8 (*)    MCH 25.1 (*)    MCHC 27.0 (*)    Neutro Abs 17.7 (*)    Monocytes Absolute 1.1 (*)    Abs Immature Granulocytes 1.96 (*)    All other components within normal limits  COMPREHENSIVE METABOLIC PANEL - Abnormal; Notable for the following components:   Sodium 115 (*)    Potassium 7.3 (*)    Chloride 80 (*)    CO2 13 (*)      Glucose, Bld 1,112 (*)    Creatinine, Ser 1.78 (*)    Calcium 8.6 (*)    Albumin 2.9 (*)    AST 71 (*)    ALT 55 (*)    Alkaline Phosphatase 127 (*)    GFR calc non Af Amer 30 (*)  GFR calc Af Amer 34 (*)    Anion gap 22 (*)    All other components within normal limits  PHOSPHORUS - Abnormal; Notable for the following components:   Phosphorus 10.7 (*)    All other components within normal limits  LACTIC ACID, PLASMA - Abnormal; Notable for the following components:   Lactic Acid, Venous 6.9 (*)    All other components within normal limits  CBG MONITORING, ED - Abnormal; Notable for the following components:   Glucose-Capillary >600 (*)    All other components within normal limits  POCT I-STAT 7, (LYTES, BLD GAS, ICA,H+H) - Abnormal; Notable for the following components:   pH, Arterial 7.120 (*)    pO2, Arterial 234.0 (*)    Bicarbonate 15.4 (*)    TCO2 17 (*)    Acid-base deficit 14.0 (*)    Sodium 123 (*)    Potassium 6.4 (*)    Calcium, Ion 1.12 (*)    All other components within normal limits  CBG MONITORING, ED - Abnormal; Notable for the following components:   Glucose-Capillary >600 (*)    All other components within normal limits  URINE CULTURE  CULTURE, BLOOD (ROUTINE X 2)  CULTURE, BLOOD (ROUTINE X 2)  APTT  MAGNESIUM  BLOOD GAS, ARTERIAL  TROPONIN I  LACTIC ACID, PLASMA  BETA-HYDROXYBUTYRIC ACID  TROPONIN I  TROPONIN I  I-STAT TROPONIN, ED    EKG EKG Interpretation  Date/Time:  Saturday December 13 2018 08:53:48 EDT Ventricular Rate:  68 PR Interval:    QRS Duration: 185 QT Interval:  505 QTC Calculation: 538 R Axis:   -66 Text Interpretation:  Sinus rhythm Prolonged PR interval RAE, consider biatrial enlargement RBBB and LAFB LVH with secondary repolarization abnormality Since prior ECG< she is now in sinus rhythm Confirmed by Alvira Monday ([id number]) on 11/24/2018 9:23:06 AM   Radiology Dg Chest Portable 1 View  Result Date:  12/16/2018 CLINICAL DATA:  Cardiac arrest and status post intubation. EXAM: PORTABLE CHEST 1 VIEW COMPARISON:  None. FINDINGS: The heart is moderately enlarged. Endotracheal tube tip is approximately at the carina. Gastric decompression tube extends below the diaphragm. Lungs demonstrate bibasilar atelectasis, right greater than left. No overt pulmonary edema, pleural fluid or pneumothorax identified. There are multiple rib fractures visible by portable chest x-ray including probable left second, right fourth, right fifth, right sixth and right seventh fractures. IMPRESSION: 1. Endotracheal tube tip likely lies at the carina. There may be benefit in retracting the tube by 1-3 cm. 2. Multiple rib fractures visible by portable chest x-ray, right greater than left. No obvious pneumothorax. 3. Cardiac enlargement and bibasilar atelectasis, right greater than left. Electronically Signed   By: Irish Lack M.D.   On: 12/09/2018 09:23   Dg Abd Portable 1 View  Result Date: 11/27/2018 CLINICAL DATA:  Tube placement. EXAM: PORTABLE ABDOMEN - 1 VIEW COMPARISON:  Chest radiograph of same date. FINDINGS: Nasogastric terminates at the body of the stomach. Grossly normal bowel gas pattern, without gross free intraperitoneal air. IMPRESSION: Nasogastric tube terminating at the body of the stomach. Electronically Signed   By: Jeronimo Greaves M.D.   On: 12/12/2018 09:18    Procedures .Critical Care Performed by: Alvira Monday, MD Authorized by: Alvira Monday, MD   Critical care provider statement:    Critical care time (minutes):  120   Critical care was necessary to treat or prevent imminent or life-threatening deterioration of the following conditions:  Circulatory failure, respiratory failure and metabolic crisis  Critical care was time spent personally by me on the following activities:  Re-evaluation of patient's condition, ordering and review of radiographic studies, ordering and review of laboratory  studies, ordering and performing treatments and interventions, examination of patient, evaluation of patient's response to treatment, discussions with consultants, development of treatment plan with patient or surrogate and obtaining history from patient or surrogate Procedure Name: Intubation Date/Time: 01/12/19 1:08 PM Performed by: Alvira Monday, MD Pre-anesthesia Checklist: Patient identified Induction Type: IV induction Laryngoscope Size: Glidescope Grade View: Grade I Tube size: 7.5 mm Number of attempts: 1 Placement Confirmation: ETT inserted through vocal cords under direct vision Future Recommendations: Recommend- induction with short-acting agent, and alternative techniques readily available      (including critical care time)  Medications Ordered in ED Medications  norepinephrine (LEVOPHED) 4mg  in premix infusion (0 mcg/min Intravenous Paused 2019-01-12 1106)  aspirin suppository 300 mg (has no administration in time range)  propofol (DIPRIVAN) 1000 MG/100ML infusion (25 mcg/kg/min  95.3 kg Intravenous Rate/Dose Change January 12, 2019 1200)  insulin regular, human (MYXREDLIN) 100 units/ 100 mL infusion (10.8 Units/hr Intravenous Rate/Dose Change Jan 12, 2019 1239)  dextrose 5 %-0.45 % sodium chloride infusion ( Intravenous Stopped January 12, 2019 1231)  vancomycin (VANCOCIN) 2,000 mg in sodium chloride 0.9 % 500 mL IVPB (2,000 mg Intravenous New Bag/Given 2019-01-12 1123)  sodium bicarbonate 150 mEq in sterile water 1,000 mL infusion ( Intravenous New Bag/Given 01-12-2019 1122)  piperacillin-tazobactam (ZOSYN) IVPB 3.375 g (has no administration in time range)  vancomycin (VANCOCIN) 1,250 mg in sodium chloride 0.9 % 250 mL IVPB (has no administration in time range)  amiodarone (CORDARONE) 150 mg in dextrose 5 % 100 mL bolus (150 mg Intravenous New Bag/Given 12-Jan-2019 0845)  etomidate (AMIDATE) injection (20 mg Intravenous Given 12-Jan-2019 0844)  rocuronium (ZEMURON) injection (90 mg Intravenous  Given 12-Jan-2019 0844)  0.9 %  sodium chloride infusion ( Intravenous Stopped 01/12/19 1106)  norepinephrine (LEVOPHED) injection (5 mcg Intravenous Given January 12, 2019 0856)  sodium chloride 0.9 % bolus 1,000 mL (0 mLs Intravenous Stopped 2019/01/12 1230)  calcium gluconate 1 g in sodium chloride 0.9 % 100 mL IVPB (0 g Intravenous Stopped Jan 12, 2019 1202)  insulin aspart (novoLOG) injection 10 Units (10 Units Intravenous Given 2019/01/12 1128)  piperacillin-tazobactam (ZOSYN) IVPB 3.375 g (0 g Intravenous Stopped Jan 12, 2019 1201)     Initial Impression / Assessment and Plan / ED Course  I have reviewed the triage vital signs and the nursing notes.  Pertinent labs & imaging results that were available during my care of the patient were reviewed by me and considered in my medical decision making (see chart for details).        63 year old female with a history of COPD, diabetes, hypertension, hyperlipidemia, coronary artery disease with history of multiple cardiac stents, who presents post cardiac arrest, with EMS initially called out for shortness of breath, witnessed arrest by family, and shockable rhythm for fire department.   On arrival to the emergency department, patient with blood pressures 120s, decorticate posturing.  King airway was switched to ETT.  After intubation, she did have temporary decrease in blood pressures, was started on levophed.  Given wide complex rhythm, likely VT with improvement with amio, shockable rhythm at arrest, cardiac hx, consulted Cardiology who came to bedside to evaluate the patient.   Labs returned showing glucose of greater than 1000, creatinine of 1.78, potassium of 7.3, bicarb 13, AG 22. Corrected Na is 139. WBC 230000.  UA does not show ketones.  Unclear if possible DKA  with negative ketonuria or severe hyperglycemia with possible other AG metabolic acidosis (lactate from arrest.)  Lactate pending.  She is given IV Vanco and Zosyn for possible sepsis given  leukocytosis. Able to wean off of levophed. On propofol for sedation  For hyperkalemia, she is given insulin bolus, IV fluids, calcium gluconate, and started on bicarb drip for metabolic acidosis and hyperkalemia.  She was started on an insulin drip for hyperglycemia and possible DKA.  Most likely, patient with arrest secondary to hyperglycemia, suspected acute kidney injury, metabolic acidosis and hyperkalemia with wide complex cardiac arrhythmia.  This is in the setting of her being out of her home metformin.     Final Clinical Impressions(s) / ED Diagnoses   Final diagnoses:  Cardiac arrest (HCC)  Hyperkalemia  Metabolic acidosis  Hyperglycemia  Leukocytosis, unspecified type  Closed fracture of multiple ribs of both sides, initial encounter    ED Discharge Orders    None       Alvira MondaySchlossman, Nolton Denis, MD 2018-11-11 1312

## 2018-12-13 NOTE — Progress Notes (Signed)
1540 CDS called and updated

## 2018-12-13 NOTE — Progress Notes (Signed)
Pharmacy Antibiotic Note  Nicole Lee is a 63 y.o. female admitted on 25-Dec-2018 with sepsis.  Pharmacy has been consulted for vancomycin/zosyn dosing. WBC 23.8. SCr 1.78 on admit.  Plan: Zosyn 3.375g IV ( infusion) x1; then 3.375g IV q8h (4h infusion) Vancomycin 2g IV x1; then Vancomycin 1250 mg IV Q 48 hrs. Goal AUC 400-550. Expected AUC: 538 SCr used: 1.78 Monitor clinical progress, c/s, renal function F/u de-escalation plan/LOT, vancomycin trough as indicated   Height: 5\' 1"  (154.9 cm) Weight: 210 lb (95.3 kg) IBW/kg (Calculated) : 47.8  No data recorded.  Recent Labs  Lab 2018/12/25 0843  WBC 23.8*  CREATININE 1.78*    Estimated Creatinine Clearance: 33.7 mL/min (A) (by C-G formula based on SCr of 1.78 mg/dL (H)).    No Known Allergies  Antimicrobials this admission: 3/28 vancomycin >>  3/28 zosyn >>   Dose adjustments this admission:   Microbiology results:   Babs Bertin, PharmD, BCPS Clinical Pharmacist 25-Dec-2018 10:51 AM

## 2018-12-13 NOTE — Progress Notes (Signed)
Patient came in by EMS with a king airway being manually ventilated, ED MD placed a 7.5 ETT taped at 22 cm at lip, good color change on ETCO2 detector, capnography at 32, SATS 100%, will continue to monitor patient

## 2018-12-13 NOTE — ED Triage Notes (Signed)
Pt BIB [facility] for [employer]. EMS advised they were called out by family for SOB. EMS advised the pt was trying to get back to her bed and collapsed. EMS advised family started CPR and FD got on scene, performed 3 shocks, and upon EMS's arrival pt was in PEA. EMS advised they administered 3 epi via IO and placed a #5 king airway. EMS advised they obtained ROSC at 0755 with the initial CPR time of 0737 by family and 0745 by EMS. EMS advised once ROSC was obtained the pt remained tachy for them but they maintained a pulse the entire time.

## 2018-12-13 NOTE — Progress Notes (Signed)
  Echocardiogram 2D Echocardiogram has been performed.  Delcie Roch 12/29/18, 2:00 PM

## 2018-12-13 NOTE — Code Documentation (Signed)
Pt intubated by Dr Alvira Monday with a 7.5 ETT and secured at 25cm @ the lip.

## 2018-12-13 NOTE — ED Notes (Signed)
Pt's son's name is Onalee Hua and his phone # is [phone].

## 2018-12-13 NOTE — Procedures (Signed)
Arterial Catheter Insertion Procedure Note Nicole Lee [id number] [birth year]  Procedure: Insertion of Arterial Catheter  Indications: Frequent blood sampling  Procedure Details Consent: Unable to obtain consent because of altered level of consciousness. Time Out: Verified patient identification, verified procedure, site/side was marked, verified correct patient position, special equipment/implants available, medications/allergies/relevent history reviewed, required imaging and test results available.  Performed  Maximum sterile technique was used including antiseptics, cap, gloves, gown, hand hygiene, mask and sheet. Skin prep: Chlorhexidine; local anesthetic administered 20 gauge catheter was inserted into right radial artery using the Seldinger technique. ULTRASOUND GUIDANCE USED: NO Evaluation Blood flow good; BP tracing good. Complications: No apparent complications.   Nicole Lee 11/18/2018

## 2018-12-13 NOTE — H&P (Signed)
NAME:  Nicole Lee, MRN:  [id number], DOB:  [birth year], LOS: 0 ADMISSION DATE:  11/29/2018, CONSULTATION DATE:  11/14/18 REFERRING MD:  EDP Dr. Dalene Seltzer  , CHIEF COMPLAINT:  Cardiac Arrest    Brief History   62 year old female former smoker admitted 328 for witnessed cardiac arrest at home with CPR x10 minutes//initial rhythm PEA, defib and epi x3./ROSC .  Blood sugar greater than 1000 with hyperkalemia.  PCCM to admit  History of present illness   63 year old female former smoker with a known history of COPD, diabetes, hypertension, hyperlipidemia, coronary artery disease with previous multiple cardiac stents.  Morning of 12/12/2018 patient was at home.  Started having some shortness of breath.  She attempted to ambulate to the bathroom and collapsed.  Was found to be in cardiac arrest.  Family member started CPR.  EMS on arrival noted patient was in PEA.  She was given epi x3.  A King airway was placed.  She had a total downtime of 18 minutes.  She did have a defibrillation x3.  According to ER notes.  Patient had a cough with her COPD.  Had not had any known fevers.  Had no known sick contacts or travel.  Family members are without acute illness.  Patient's blood sugar was found to be greater than 1000.  Her potassium was elevated.  Patient son did tell the ER doctor that she had been out of her metformin. In the ER patient was changed from a King airway to oral ETT.  Shortly after intubation patient did have hypotension.  She was started on Levophed briefly.  But blood pressure rebounded and Levophed was stopped.  Her creatinine was 1.78.  Potassium was 7.3.  Bicarb is 13.  Anion gap was 22.  White count was elevated at 23,000.  UA did not show ketones.  Lactate was 6.9.  Patient was started on Vanco and Zosyn empirically.  She was started on a bicarb drip.  She was given calcium in the ER.  Started on insulin drip.  She was given 2 L fluid bolus.  Patient did have some wide-complex QRS.   He was seen by cardiology for consult.  He was placed ice packs. In the ER.  Patient is sedated on the ventilator -unresponsive.    Past Medical History  Diabetes, hyperlipidemia, COPD, coronary artery disease  Significant Hospital Events     Consults:  3/28 cardiology  Procedures:  3/28 ETT  Significant Diagnostic Tests:  3/28 echo>>  Micro Data:  3/28 blood culture>> 328/urine culture>>  Antimicrobials:  3/28 vancomycin>> 3/28 Zosyn  Interim history/subjective:  Sedated on the vent  Objective   Blood pressure (!) 163/85, pulse (!) 104, resp. rate 18, height  (1.549 m), weight 95.3 kg, SpO2 100 %.    Vent Mode: PRVC FiO2 (%):  [100 %] 100 % Set Rate:  [22 bmp] 22 bmp Vt Set:  [420 mL] 420 mL PEEP:  [5 cmH20] 5 cmH20 Plateau Pressure:  [20 cmH20] 20 cmH20   Intake/Output Summary (Last 24 hours) at 11/27/2018 1235 Last data filed at 12/10/2018 1230 Gross per 24 hour  Intake 1000 ml  Output -  Net 1000 ml   Filed Weights   11/20/2018 0852  Weight: 95.3 kg    Examination: General: Morbidly obese female sedated on vent HENT: AT/[place], MMM, ETT Lungs: Coarse breath sounds bilaterally on vent Cardiovascular: S1-S2, grade 1 systolic murmur Abdomen: Obese, hypoactive bowel sounds Extremities: Intact, trace edema Neuro: Sedated, unresponsive GU:  Foley in place  Resolved Hospital Problem list     Assessment & Plan:  1. Witnessed Cardiac Arrest questionable etiology - in setting of underlying known CAD . Troponin/Echo pending . Severe Hyperglycemia possible DKA and metabolic imbalances  -Not a candidate for TTM cooling protocol as out of window, metabolic imbalance and other etiology most likely the underlying cause   Plan  Appreciate card consult  Echo pending  Tr troponin  Tr LA    2. Respiratory failure- intubated to protect airway with cardiac arrest  COPD  BB atx Leveda Anna PNA   Plan  Repeat CXR -check ETT placement  Vent support  Tr ABG   Empiric ABX  VAP    3. . AG Metabolic Acidosis /Lactic Acidosis  LA 6.9 , AG 22  Plan  Continue HCO3 drip  Repeat ABG in 2 hr   4.  Hyperglycemia Leveda Anna DKA  BS >1000  Plan  Begin DKA protocol  Check Beta Hydroxybutyric acid   5. Hyponatremia NA 115  Hyperkalemia   K 7.3 >6.4 s/p insulin/Ca in ER 3/28  Hyperphosp 10.7  Plan  Tr bmet   Replace electrolytes as indicated.   6. Acute Kidney Failure   Scr 1.78 on admit  Plan  IV hydration  Tr bmet   7. Transaminitis - mild  Plan  Tr LFT   8. Leukocytosis /BB Atx  Plan  Empiric ABX w/ Vanc/Zosyn  Tr LA  Follow cx data   Best practice:  Diet: N.p.o. Pain/Anxiety/Delirium protocol (if indicated): Propofol VAP protocol (if indicated): 3/28 DVT prophylaxis: Heparin subcu GI prophylaxis: Pepcid Glucose control: Insulin drip Mobility: Bedrest Code Status: Full Family Communication: Unavailable Disposition: ICU  Labs   CBC: Recent Labs  Lab 2019-01-02 0843 01-02-19 1036  WBC 23.8*  --   NEUTROABS 17.7*  --   HGB 12.3 13.9  HCT 45.6 41.0  MCV 92.9  --   PLT 376  --     Basic Metabolic Panel: Recent Labs  Lab 01/02/2019 0843 January 02, 2019 1036  NA 115* 123*  K 7.3* 6.4*  CL 80*  --   CO2 13*  --   GLUCOSE 1,112*  --   BUN 18  --   CREATININE 1.78*  --   CALCIUM 8.6*  --   MG 2.0  --   PHOS 10.7*  --    GFR: Estimated Creatinine Clearance: 33.7 mL/min (A) (by C-G formula based on SCr of 1.78 mg/dL (H)). Recent Labs  Lab 01/02/2019 0843 Jan 02, 2019 1104  WBC 23.8*  --   LATICACIDVEN  --  6.9*    Liver Function Tests: Recent Labs  Lab January 02, 2019 0843  AST 71*  ALT 55*  ALKPHOS 127*  BILITOT 0.8  PROT 7.2  ALBUMIN 2.9*   No results for input(s): LIPASE, AMYLASE in the last 168 hours. No results for input(s): AMMONIA in the last 168 hours.  ABG    Component Value Date/Time   PHART 7.120 (LL) 01/02/2019 1036   PCO2ART 47.4 2019/01/02 1036   PO2ART 234.0 (H) 01/02/2019 1036   HCO3 15.4 (L)  January 02, 2019 1036   TCO2 17 (L) 2019/01/02 1036   ACIDBASEDEF 14.0 (H) 01-02-19 1036   O2SAT 100.0 01/02/19 1036     Coagulation Profile: Recent Labs  Lab 01/02/2019 0843  INR 1.3*    Cardiac Enzymes: No results for input(s): CKTOTAL, CKMB, CKMBINDEX, TROPONINI in the last 168 hours.  HbA1C: No results found for: HGBA1C  CBG: Recent Labs  Lab 01/02/2019 0936  GLUCAP >600*    Review of Systems:   Unable to review as pt is sedated on vent  Chart review   Past Medical History  She,  has a past medical history of Cardiac arrest (HCC) (12/10/2018), COPD (chronic obstructive pulmonary disease) (HCC), Depression, Diabetes mellitus without complication (HCC), Fluid retention, GERD (gastroesophageal reflux disease), Heart attack (HCC), Hyperlipidemia, Hypertension, Neuropathy, and Vertigo.   Surgical History    Past Surgical History:  Procedure Laterality Date  . Cardiac Stents     x11     Social History      Family History   Her family history is not on file.   Allergies No Known Allergies   Home Medications  Prior to Admission medications   Not on File     Critical care time:      Malika Demario NP-C  [facility]  [phone]  12/08/2018

## 2018-12-13 NOTE — Progress Notes (Signed)
Initial Nutrition Assessment  DOCUMENTATION CODES:  Morbid obesity  INTERVENTION:  Once BG/lytes stabilized, recommend following TF regimen:  Vital High Protein at goal rate of 50 ml/h (1200 ml per day) and  to provide 1200 kcals, 105 gm protein, 1003 ml free water daily.   NUTRITION DIAGNOSIS:  Inadequate oral intake related to inability to eat as evidenced by NPO status.  GOAL:  Patient will meet greater than or equal to 90% of their needs  MONITOR:  Diet advancement, Vent status, Labs, I & O's, TF tolerance, Weight trends  REASON FOR ASSESSMENT:  Consult Assessment and recommendations for tube feeding  ASSESSMENT:  63 y/o female PMHx COPD, DM2, HTN/HLD, previous Tobacco Abuse, CAD. Suffered witnessed cardiac arrest at home w/ CPR x10 min, defib and Epi x3 w/ [place]. Intubated in field. Labs showed BG >1000 and widespread electrolyte derangement w/ AKI. Admitted to ICU and started on insulin gtt. Not candidate for TTM.   RD operating remotely d/t COVID precautionary measures. Consult / chart reviewed/  Minimal information available. There are no prior encounters in chart to view. From EDP note, family voiced that pt had recently developed a cough and had been weaker than normal. No mention of intake/weight  Only available wt in chart is 214.8 lbs. No past history available to compare this to.   Patient is currently intubated on ventilator support MV: 9.1 L/min Temp (24hrs), Avg:93.1 F (33.9 C), Min:92.7 F (33.7 C), Max:93.4 F (34.1 C) Propofol: D/cd. Just switched to fentanyl BP:149/94 (109)  Labs: K: 7.3->6.4, BG: 1112->>600 ->589->540. Phos:10.7, a1c:12.5, albumin:2.9, na: 115->123  Meds: H2RA,   Sedation/analgesia: Propofol d/cd @1600 ->fentanyl Vasopressor/inotropic support: Levophed Other infusions: Bicarb, ivf, iv abx, insulin gtt  Recent Labs  Lab 12/01/2018 0843 12/07/2018 1036  NA 115* 123*  K 7.3* 6.4*  CL 80*  --   CO2 13*  --   BUN 18  --   CREATININE  1.78*  --   CALCIUM 8.6*  --   MG 2.0  --   PHOS 10.7*  --   GLUCOSE 1,112*  --     NUTRITION - FOCUSED PHYSICAL EXAM: Unable to conduct  Diet Order:   Diet Order            Diet NPO time specified  Diet effective now             EDUCATION NEEDS:  No education needs have been identified at this time  Skin:  Skin Assessment: Reviewed RN Assessment  Last BM:  Unknown/PTA  Height:  Ht Readings from Last 1 Encounters:  12/06/2018 5\' 1"  (1.549 m)   Weight:  Wt Readings from Last 1 Encounters:  12/14/2018 97.4 kg  No further wt hx available  Ideal Body Weight:  47.73 kg  BMI:  Body mass index is 40.57 kg/m.  Estimated Nutritional Needs:  Kcal:  1070-1360 kcals (11-14 kcal/kg bw) Protein:  >95g Pro (2g/kg bw) Fluid:  Per MD assessment  Christophe Louis RD, LDN, CNSC Clinical Nutrition Available Tues-Sat via Pager: [phone] 12/06/2018 4:12 PM

## 2018-12-13 NOTE — ED Notes (Signed)
ED TO INPATIENT HANDOFF REPORT  ED Nurse Name and Phone #: [phone]  S Name/Age/Gender Titus Dubin 63 y.o. female Room/Bed: TRAAC/TRAAC  Code Status   Code Status: Full Code  Home/SNF/Other Unknown  Is this baseline? No   Triage Complete: Triage complete  Chief Complaint post cpr  Triage Note Pt BIB [facility] for Post CPR. EMS advised they were called out by family for SOB. EMS advised the pt was trying to get back to her bed and collapsed. EMS advised family started CPR and FD got on scene, performed 3 shocks, and upon EMS's arrival pt was in PEA. EMS advised they administered 3 epi via IO and placed a #5 king airway. EMS advised they obtained ROSC at 0755 with the initial CPR time of 0737 by family and 0745 by EMS. EMS advised once ROSC was obtained the pt remained tachy for them but they maintained a pulse the entire time.   Allergies No Known Allergies  Level of Care/Admitting Diagnosis ED Disposition    ED Disposition Condition Comment   Admit  Hospital Area: [facility] [[id number]]  Level of Care: ICU [6]  Diagnosis: Cardiac arrest (HCC) [427.5.ICD-9-CM]  Admitting Physician: Josephine Igo [[id number]]  Attending Physician: Josephine Igo [[id number]]  Estimated length of stay: past midnight tomorrow  Certification:: I certify this patient will need inpatient services for at least 2 midnights  PT Class (Do Not Modify): Inpatient [101]  PT Acc Code (Do Not Modify): Private [1]       B Medical/Surgery History Past Medical History:  Diagnosis Date  . Cardiac arrest (HCC) 12/25/18  . COPD (chronic obstructive pulmonary disease) (HCC)   . Depression   . Diabetes mellitus without complication (HCC)   . Fluid retention   . GERD (gastroesophageal reflux disease)   . Heart attack (HCC)    x7  . Hyperlipidemia   . Hypertension   . Neuropathy   . Vertigo    Past Surgical History:  Procedure Laterality Date  . Cardiac Stents     x11     A IV Location/Drains/Wounds Patient Lines/Drains/Airways Status   Active Line/Drains/Airways    Name:   Placement date:   Placement time:   Site:   Days:   Peripheral IV 2018-12-25 Left Other (Comment)   12/25/18    -    Other (Comment)   less than 1   Peripheral IV 12/25/2018 Left Other (Comment)   Dec 25, 2018    0837    Other (Comment)   less than 1   Peripheral IV 12/25/18 Left Forearm   25-Dec-2018    0847    Forearm   less than 1   Peripheral IV 12-25-2018 Right Hand   12-25-2018    1122    Hand   less than 1   NG/OG Tube Orogastric 18 Fr. Center mouth Xray;Aucultation   Dec 25, 2018    0901    Center mouth   less than 1   Urethral Catheter Shanna Cisco, RN Double-lumen 14 Fr.   12-25-2018    0921    Double-lumen   less than 1   Airway 7.5 mm   December 25, 2018    0850     less than 1          Intake/Output Last 24 hours  Intake/Output Summary (Last 24 hours) at 2018/12/25 1340 Last data filed at 2018-12-25 1230 Gross per 24 hour  Intake 1000 ml  Output -  Net 1000 ml  Labs/Imaging Results for orders placed or performed during the hospital encounter of 11/17/2018 (from the past 48 hour(s))  APTT     Status: None   Collection Time: 12/12/2018  8:43 AM  Result Value Ref Range   aPTT 33 24 - 36 seconds    Comment: Performed at [facility], [address], [place], [place] [address]  Protime-INR     Status: Abnormal   Collection Time: 12/05/2018  8:43 AM  Result Value Ref Range   Prothrombin Time 15.8 (H) 11.4 - 15.2 seconds   INR 1.3 (H) 0.8 - 1.2    Comment: (NOTE) INR goal varies based on device and disease states. Performed at [facility], [address] [place], [place] [address]   CBC with Differential     Status: Abnormal   Collection Time: 12/08/2018  8:43 AM  Result Value Ref Range   WBC 23.8 (H) 4.0 - 10.5 K/uL   RBC 4.91 3.87 - 5.11 MIL/uL   Hemoglobin 12.3 12.0 - 15.0 g/dL   HCT 09.8 11.9 - 14.7 %   MCV 92.9 80.0 - 100.0 fL   MCH 25.1 (L) 26.0 - 34.0 pg   MCHC  27.0 (L) 30.0 - 36.0 g/dL   RDW 82.9 56.2 - 13.0 %   Platelets 376 150 - 400 K/uL   nRBC 0.1 0.0 - 0.2 %   Neutrophils Relative % 74 %   Neutro Abs 17.7 (H) 1.7 - 7.7 K/uL   Lymphocytes Relative 12 %   Lymphs Abs 3.0 0.7 - 4.0 K/uL   Monocytes Relative 5 %   Monocytes Absolute 1.1 (H) 0.1 - 1.0 K/uL   Eosinophils Relative 0 %   Eosinophils Absolute 0.1 0.0 - 0.5 K/uL   Basophils Relative 1 %   Basophils Absolute 0.1 0.0 - 0.1 K/uL   WBC Morphology      MODERATE LEFT SHIFT (>5% METAS AND MYELOS,OCC PRO NOTED)   Immature Granulocytes 8 %   Abs Immature Granulocytes 1.96 (H) 0.00 - 0.07 K/uL    Comment: Performed at [facility], [address] [place], [place] [address]  Comprehensive metabolic panel     Status: Abnormal   Collection Time: 12/15/2018  8:43 AM  Result Value Ref Range   Sodium 115 (LL) 135 - 145 mmol/L    Comment: CRITICAL RESULT CALLED TO, READ BACK BY AND VERIFIED WITH: Kashira Behunin [facility] RN AT 1020 12/15/2018 BY WOOLLENK    Potassium 7.3 (HH) 3.5 - 5.1 mmol/L    Comment: CRITICAL RESULT CALLED TO, READ BACK BY AND VERIFIED WITH: Json Koelzer [facility] RN AT 1020 12/14/2018 BY WOOLLENK NO VISIBLE HEMOLYSIS    Chloride 80 (L) 98 - 111 mmol/L   CO2 13 (L) 22 - 32 mmol/L   Glucose, Bld 1,112 (HH) 70 - 99 mg/dL    Comment: CRITICAL RESULT CALLED TO, READ BACK BY AND VERIFIED WITH: Loreli Debruler [facility] RN AT 1020 12/04/2018 BY WOOLLENK    BUN 18 8 - 23 mg/dL   Creatinine, Ser 4.69 (H) 0.44 - 1.00 mg/dL   Calcium 8.6 (L) 8.9 - 10.3 mg/dL   Total Protein 7.2 6.5 - 8.1 g/dL   Albumin 2.9 (L) 3.5 - 5.0 g/dL   AST 71 (H) 15 - 41 U/L   ALT 55 (H) 0 - 44 U/L   Alkaline Phosphatase 127 (H) 38 - 126 U/L   Total Bilirubin 0.8 0.3 - 1.2 mg/dL   GFR calc non Af Amer 30 (L) >  60 mL/min   GFR calc Af Amer 34 (L) >60 mL/min   Anion gap 22 (H) 5 - 15    Comment: Performed at [facility], [address] [place], [place] [address]  Magnesium     Status: None   Collection Time: 2019/01/02   8:43 AM  Result Value Ref Range   Magnesium 2.0 1.7 - 2.4 mg/dL    Comment: Performed at [facility], [address], [place], [place] [address]  Phosphorus     Status: Abnormal   Collection Time: Jan 02, 2019  8:43 AM  Result Value Ref Range   Phosphorus 10.7 (H) 2.5 - 4.6 mg/dL    Comment: Performed at [facility], [address] [place], [place] [address]  Urinalysis, Routine w reflex microscopic     Status: Abnormal   Collection Time: 02-Jan-2019  9:31 AM  Result Value Ref Range   Color, Urine YELLOW YELLOW   APPearance CLEAR CLEAR   Specific Gravity, Urine 1.022 1.005 - 1.030   pH 6.0 5.0 - 8.0   Glucose, UA >=500 (A) NEGATIVE mg/dL   Hgb urine dipstick MODERATE (A) NEGATIVE   Bilirubin Urine NEGATIVE NEGATIVE   Ketones, ur NEGATIVE NEGATIVE mg/dL   Protein, ur 30 (A) NEGATIVE mg/dL   Nitrite NEGATIVE NEGATIVE   Leukocytes,Ua NEGATIVE NEGATIVE   RBC / HPF 0-5 0 - 5 RBC/hpf   WBC, UA 6-10 0 - 5 WBC/hpf   Bacteria, UA RARE (A) NONE SEEN   Squamous Epithelial / LPF 0-5 0 - 5    Comment: Performed at [facility], [address], [place], [place] [address]  CBG monitoring, ED     Status: Abnormal   Collection Time: 01/02/2019  9:36 AM  Result Value Ref Range   Glucose-Capillary >600 (HH) 70 - 99 mg/dL   Comment 1 Notify RN    Comment 2 Document in Chart   I-STAT 7, (LYTES, BLD GAS, ICA, H+H)     Status: Abnormal   Collection Time: Jan 02, 2019 10:36 AM  Result Value Ref Range   pH, Arterial 7.120 (LL) 7.350 - 7.450   pCO2 arterial 47.4 32.0 - 48.0 mmHg   pO2, Arterial 234.0 (H) 83.0 - 108.0 mmHg   Bicarbonate 15.4 (L) 20.0 - 28.0 mmol/L   TCO2 17 (L) 22 - 32 mmol/L   O2 Saturation 100.0 %   Acid-base deficit 14.0 (H) 0.0 - 2.0 mmol/L   Sodium 123 (L) 135 - 145 mmol/L   Potassium 6.4 (HH) 3.5 - 5.1 mmol/L   Calcium, Ion 1.12 (L) 1.15 - 1.40 mmol/L   HCT 41.0 36.0 - 46.0 %   Hemoglobin 13.9 12.0 - 15.0 g/dL   Patient temperature 84.1 F    Collection site  RADIAL, ALLEN'S TEST ACCEPTABLE    Drawn by RT    Sample type ARTERIAL    Comment NOTIFIED PHYSICIAN   Lactic acid, plasma     Status: Abnormal   Collection Time: 01/02/2019 11:04 AM  Result Value Ref Range   Lactic Acid, Venous 6.9 (HH) 0.5 - 1.9 mmol/L    Comment: CRITICAL RESULT CALLED TO, READ BACK BY AND VERIFIED WITH: Loistine Chance RN AT 1141 01/02/2019 BY [facility] Performed at [facility], [address] [place], [place] [address]   CBG monitoring, ED     Status: Abnormal   Collection Time: 01-02-19 12:35 PM  Result Value Ref Range   Glucose-Capillary >600 (HH) 70 - 99 mg/dL  Dg Chest Portable 1 View  Result Date: 12/16/2018 CLINICAL DATA:  Cardiac arrest and status post intubation. EXAM: PORTABLE CHEST 1 VIEW COMPARISON:  None. FINDINGS: The heart is moderately enlarged. Endotracheal tube tip is approximately at the carina. Gastric decompression tube extends below the diaphragm. Lungs demonstrate bibasilar atelectasis, right greater than left. No overt pulmonary edema, pleural fluid or pneumothorax identified. There are multiple rib fractures visible by portable chest x-ray including probable left second, right fourth, right fifth, right sixth and right seventh fractures. IMPRESSION: 1. Endotracheal tube tip likely lies at the carina. There may be benefit in retracting the tube by 1-3 cm. 2. Multiple rib fractures visible by portable chest x-ray, right greater than left. No obvious pneumothorax. 3. Cardiac enlargement and bibasilar atelectasis, right greater than left. Electronically Signed   By: Irish Lack M.D.   On: December 16, 2018 09:23   Dg Abd Portable 1 View  Result Date: 12/16/2018 CLINICAL DATA:  Tube placement. EXAM: PORTABLE ABDOMEN - 1 VIEW COMPARISON:  Chest radiograph of same date. FINDINGS: Nasogastric terminates at the body of the stomach. Grossly normal bowel gas pattern, without gross free intraperitoneal air. IMPRESSION: Nasogastric tube terminating at the body  of the stomach. Electronically Signed   By: Jeronimo Greaves M.D.   On: 12/16/2018 09:18    Pending Labs Unresulted Labs (From admission, onward)    Start     Ordered   12/14/18 0500  CBC  Tomorrow morning,   R     12/16/18 1323   12/14/18 0500  Blood gas, arterial  Tomorrow morning,   R     12/16/18 1323   12/14/18 0500  Magnesium  Tomorrow morning,   R     16-Dec-2018 1323   12/14/18 0500  Phosphorus  Tomorrow morning,   R     12-16-18 1323   2018/12/16 1530  Blood gas, arterial  Once,   R     2018-12-16 1323   Dec 16, 2018 1338  Troponin I - Now Then Q6H  Now then every 6 hours,   R     12/16/18 1138   12/16/2018 1329  Beta-hydroxybutyric acid  Once,   R     12/16/2018 1329   2018-12-16 1326  Hemoglobin A1c  Once,   R     2018/12/16 1327   16-Dec-2018 1325  Basic metabolic panel  STAT Now then every 4 hours ,   STAT     2018/12/16 1327   16-Dec-2018 1259  Strep pneumoniae urinary antigen  (not at [facility])  Once,   R     12-16-18 1323   12-16-18 1259  Legionella Pneumophila Serogp 1 Ur Ag  Once,   R     Dec 16, 2018 1323   2018-12-16 1257  Procalcitonin  Once,   R     12-16-18 1323   12-16-2018 1252  HIV antibody (Routine Testing)  Once,   R     12-16-18 1323   12-16-18 1116  Beta-hydroxybutyric acid  Once,   R     12-16-2018 1115   12-16-18 1027  Lactic acid, plasma  Now then every 2 hours,   STAT     12/16/2018 1029   December 16, 2018 1026  Blood culture (routine x 2)  BLOOD CULTURE X 2,   STAT     Dec 16, 2018 1029   12-16-2018 0951  Troponin I - ONCE - STAT  ONCE - STAT,   STAT     12-16-18 0950   12-16-2018 0931  Urine culture  ONCE - STAT,   STAT     12-14-2018 0930   12-14-2018 0904  Blood gas, arterial  Once,   STAT     12-14-2018 0904          Vitals/Pain Today's Vitals   12-14-2018 1250 12-14-2018 1300 12-14-2018 1310 12-14-2018 1320  BP: (!) 155/92 (!) 151/89 (!) 153/89 (!) 154/90  Pulse: (!) 104 100 100 97  Resp: 18 (!) 22 19 19   SpO2: 100% 100% 100% 100%  Weight:      Height:        Isolation Precautions No active  isolations  Medications Medications  norepinephrine (LEVOPHED) 4mg  in 250mL premix infusion (0 mcg/min Intravenous Paused 06-Oct-2018 1106)  aspirin suppository 300 mg (has no administration in time range)  propofol (DIPRIVAN) 1000 MG/100ML infusion (45 mcg/kg/min  95.3 kg Intravenous Rate/Dose Change 06-Oct-2018 1332)  insulin regular, human (MYXREDLIN) 100 units/ 100 mL infusion (10.8 Units/hr Intravenous Rate/Dose Change 06-Oct-2018 1239)  dextrose 5 %-0.45 % sodium chloride infusion ( Intravenous Stopped 06-Oct-2018 1231)  sodium bicarbonate 150 mEq in sterile water 1,000 mL infusion ( Intravenous New Bag/Given 06-Oct-2018 1122)  piperacillin-tazobactam (ZOSYN) IVPB 3.375 g (has no administration in time range)  vancomycin (VANCOCIN) 1,250 mg in sodium chloride 0.9 % 250 mL IVPB (has no administration in time range)  heparin injection 5,000 Units (has no administration in time range)  famotidine (PEPCID) IVPB 20 mg in NS 100 mL IVPB (has no administration in time range)  acetaminophen (TYLENOL) tablet 650 mg (has no administration in time range)  ondansetron (ZOFRAN) injection 4 mg (has no administration in time range)  0.9 %  sodium chloride infusion (has no administration in time range)  insulin regular, human (MYXREDLIN) 100 units/ 100 mL infusion (has no administration in time range)  amiodarone (CORDARONE) 150 mg in dextrose 5 % 100 mL bolus (150 mg Intravenous New Bag/Given 06-Oct-2018 0845)  etomidate (AMIDATE) injection (20 mg Intravenous Given 06-Oct-2018 0844)  rocuronium (ZEMURON) injection (90 mg Intravenous Given 06-Oct-2018 0844)  0.9 %  sodium chloride infusion ( Intravenous Stopped 06-Oct-2018 1106)  norepinephrine (LEVOPHED) injection (5 mcg Intravenous Given 06-Oct-2018 0856)  sodium chloride 0.9 % bolus 1,000 mL (0 mLs Intravenous Stopped 06-Oct-2018 1230)  calcium gluconate 1 g in sodium chloride 0.9 % 100 mL IVPB (0 g Intravenous Stopped 06-Oct-2018 1202)  insulin aspart (novoLOG) injection 10 Units (10 Units  Intravenous Given 06-Oct-2018 1128)  piperacillin-tazobactam (ZOSYN) IVPB 3.375 g (0 g Intravenous Stopped 06-Oct-2018 1201)  vancomycin (VANCOCIN) 2,000 mg in sodium chloride 0.9 % 500 mL IVPB (2,000 mg Intravenous New Bag/Given 06-Oct-2018 1123)    Mobility walks High fall risk   Focused Assessments Cardiac Assessment Handoff:  Cardiac Rhythm: Normal sinus rhythm No results found for: CKTOTAL, CKMB, CKMBINDEX, TROPONINI No results found for: DDIMER Does the Patient currently have chest pain?      R Recommendations: See Admitting Provider Note  Report given to:   Additional Notes:

## 2018-12-13 NOTE — ED Notes (Signed)
Paged critical care and cardiology to Dr. Dalene Seltzer

## 2018-12-14 ENCOUNTER — Inpatient Hospital Stay (HOSPITAL_COMMUNITY): Payer: Medicaid Other

## 2018-12-14 ENCOUNTER — Encounter (HOSPITAL_COMMUNITY): Payer: Self-pay | Admitting: Physician Assistant

## 2018-12-14 DIAGNOSIS — IMO0001 Reserved for inherently not codable concepts without codable children: Secondary | ICD-10-CM

## 2018-12-14 DIAGNOSIS — D72829 Elevated white blood cell count, unspecified: Secondary | ICD-10-CM

## 2018-12-14 DIAGNOSIS — I214 Non-ST elevation (NSTEMI) myocardial infarction: Secondary | ICD-10-CM

## 2018-12-14 DIAGNOSIS — I5022 Chronic systolic (congestive) heart failure: Secondary | ICD-10-CM

## 2018-12-14 DIAGNOSIS — Z794 Long term (current) use of insulin: Secondary | ICD-10-CM

## 2018-12-14 DIAGNOSIS — E119 Type 2 diabetes mellitus without complications: Secondary | ICD-10-CM

## 2018-12-14 DIAGNOSIS — J449 Chronic obstructive pulmonary disease, unspecified: Secondary | ICD-10-CM | POA: Diagnosis present

## 2018-12-14 DIAGNOSIS — I10 Essential (primary) hypertension: Secondary | ICD-10-CM | POA: Diagnosis present

## 2018-12-14 DIAGNOSIS — E872 Acidosis: Secondary | ICD-10-CM

## 2018-12-14 DIAGNOSIS — I5043 Acute on chronic combined systolic (congestive) and diastolic (congestive) heart failure: Secondary | ICD-10-CM

## 2018-12-14 LAB — LACTIC ACID, PLASMA: Lactic Acid, Venous: 1.1 mmol/L (ref 0.5–1.9)

## 2018-12-14 LAB — BLOOD CULTURE ID PANEL (REFLEXED)

## 2018-12-14 LAB — CBC
HCT: 35.4 % — ABNORMAL LOW (ref 36.0–46.0)
Hemoglobin: 10.9 g/dL — ABNORMAL LOW (ref 12.0–15.0)
MCH: 25.5 pg — ABNORMAL LOW (ref 26.0–34.0)
MCHC: 30.8 g/dL (ref 30.0–36.0)
MCV: 82.7 fL (ref 80.0–100.0)
Platelets: 187 10*3/uL (ref 150–400)
RBC: 4.28 MIL/uL (ref 3.87–5.11)
RDW: 14.6 % (ref 11.5–15.5)
WBC: 9.3 10*3/uL (ref 4.0–10.5)
nRBC: 0 % (ref 0.0–0.2)

## 2018-12-14 LAB — GLUCOSE, CAPILLARY
GLUCOSE-CAPILLARY: 119 mg/dL — AB (ref 70–99)
GLUCOSE-CAPILLARY: 310 mg/dL — AB (ref 70–99)
Glucose-Capillary: 133 mg/dL — ABNORMAL HIGH (ref 70–99)
Glucose-Capillary: 145 mg/dL — ABNORMAL HIGH (ref 70–99)
Glucose-Capillary: 147 mg/dL — ABNORMAL HIGH (ref 70–99)
Glucose-Capillary: 148 mg/dL — ABNORMAL HIGH (ref 70–99)
Glucose-Capillary: 155 mg/dL — ABNORMAL HIGH (ref 70–99)
Glucose-Capillary: 156 mg/dL — ABNORMAL HIGH (ref 70–99)
Glucose-Capillary: 159 mg/dL — ABNORMAL HIGH (ref 70–99)
Glucose-Capillary: 318 mg/dL — ABNORMAL HIGH (ref 70–99)
Glucose-Capillary: 325 mg/dL — ABNORMAL HIGH (ref 70–99)
Glucose-Capillary: 333 mg/dL — ABNORMAL HIGH (ref 70–99)

## 2018-12-14 LAB — COMPREHENSIVE METABOLIC PANEL
ALT: 36 U/L (ref 0–44)
ANION GAP: 6 (ref 5–15)
AST: 66 U/L — ABNORMAL HIGH (ref 15–41)
Albumin: 2.3 g/dL — ABNORMAL LOW (ref 3.5–5.0)
Alkaline Phosphatase: 66 U/L (ref 38–126)
BUN: 12 mg/dL (ref 8–23)
CO2: 22 mmol/L (ref 22–32)
Calcium: 7.5 mg/dL — ABNORMAL LOW (ref 8.9–10.3)
Chloride: 108 mmol/L (ref 98–111)
Creatinine, Ser: 0.7 mg/dL (ref 0.44–1.00)
GFR calc Af Amer: >60 mL/min (ref >60)
GFR calc non Af Amer: >60 mL/min (ref >60)
Glucose, Bld: 153 mg/dL — ABNORMAL HIGH (ref 70–99)
POTASSIUM: 4 mmol/L (ref 3.5–5.1)
Sodium: 136 mmol/L (ref 135–145)
TOTAL PROTEIN: 5.7 g/dL — AB (ref 6.5–8.1)
Total Bilirubin: 0.5 mg/dL (ref 0.3–1.2)

## 2018-12-14 LAB — POCT I-STAT 7, (LYTES, BLD GAS, ICA,H+H)
Acid-base deficit: 3 mmol/L — ABNORMAL HIGH (ref 0.0–2.0)
Bicarbonate: 22.6 mmol/L (ref 20.0–28.0)
Calcium, Ion: 1.16 mmol/L (ref 1.15–1.40)
HCT: 32 % — ABNORMAL LOW (ref 36.0–46.0)
HEMOGLOBIN: 10.9 g/dL — AB (ref 12.0–15.0)
O2 Saturation: 94 %
Patient temperature: 37.4
Potassium: 4 mmol/L (ref 3.5–5.1)
SODIUM: 138 mmol/L (ref 135–145)
TCO2: 24 mmol/L (ref 22–32)
pCO2 arterial: 44.1 mmHg (ref 32.0–48.0)
pH, Arterial: 7.32 — ABNORMAL LOW (ref 7.350–7.450)
pO2, Arterial: 79 mmHg — ABNORMAL LOW (ref 83.0–108.0)

## 2018-12-14 LAB — BASIC METABOLIC PANEL
Anion gap: 9 (ref 5–15)
BUN: 13 mg/dL (ref 8–23)
CO2: 21 mmol/L — ABNORMAL LOW (ref 22–32)
Calcium: 7.6 mg/dL — ABNORMAL LOW (ref 8.9–10.3)
Chloride: 105 mmol/L (ref 98–111)
Creatinine, Ser: 0.68 mg/dL (ref 0.44–1.00)
GFR calc non Af Amer: >60 mL/min (ref >60)
Glucose, Bld: 163 mg/dL — ABNORMAL HIGH (ref 70–99)
Potassium: 3.9 mmol/L (ref 3.5–5.1)
Sodium: 135 mmol/L (ref 135–145)

## 2018-12-14 LAB — TROPONIN I
TROPONIN I: 2.29 ng/mL — AB (ref <0.03)
Troponin I: 5.19 ng/mL (ref <0.03)
Troponin I: 3.98 ng/mL (ref <0.03)
Troponin I: 2.94 ng/mL (ref <0.03)

## 2018-12-14 LAB — HIV ANTIBODY (ROUTINE TESTING W REFLEX): HIV Screen 4th Generation wRfx: NONREACTIVE

## 2018-12-14 LAB — LEGIONELLA PNEUMOPHILA SEROGP 1 UR AG: L. pneumophila Serogp 1 Ur Ag: NEGATIVE

## 2018-12-14 LAB — PHOSPHORUS
PHOSPHORUS: 2.5 mg/dL (ref 2.5–4.6)
Phosphorus: 2.8 mg/dL (ref 2.5–4.6)

## 2018-12-14 LAB — MAGNESIUM
MAGNESIUM: 1.9 mg/dL (ref 1.7–2.4)
Magnesium: 2.5 mg/dL — ABNORMAL HIGH (ref 1.7–2.4)

## 2018-12-14 MED ORDER — METOPROLOL TARTRATE 12.5 MG HALF TABLET
12.5000 mg | ORAL_TABLET | Freq: Two times a day (BID) | ORAL | Status: DC
  Administered 2018-12-14: 12.5 mg via ORAL
  Filled 2018-12-14: qty 1

## 2018-12-14 MED ORDER — POTASSIUM CHLORIDE 20 MEQ/15ML (10%) PO SOLN
40.0000 meq | Freq: Once | ORAL | Status: AC
  Administered 2018-12-14: 40 meq
  Filled 2018-12-14: qty 30

## 2018-12-14 MED ORDER — MAGNESIUM SULFATE 2 GM/50ML IV SOLN
2.0000 g | Freq: Once | INTRAVENOUS | Status: AC
  Administered 2018-12-14: 2 g via INTRAVENOUS
  Filled 2018-12-14: qty 50

## 2018-12-14 MED ORDER — METOPROLOL TARTRATE 12.5 MG HALF TABLET
12.5000 mg | ORAL_TABLET | Freq: Three times a day (TID) | ORAL | Status: DC
  Administered 2018-12-14 – 2018-12-15 (×2): 12.5 mg via ORAL
  Filled 2018-12-14 (×2): qty 1

## 2018-12-14 MED ORDER — INSULIN ASPART 100 UNIT/ML ~~LOC~~ SOLN: 0.0000 [IU] | SUBCUTANEOUS | Status: DC

## 2018-12-14 MED ORDER — FUROSEMIDE 10 MG/ML IJ SOLN
20.0000 mg | Freq: Every day | INTRAMUSCULAR | Status: DC
  Administered 2018-12-15 – 2018-12-16 (×2): 20 mg via INTRAVENOUS
  Filled 2018-12-14 (×2): qty 2

## 2018-12-14 MED ORDER — NITROGLYCERIN IN D5W 200-5 MCG/ML-% IV SOLN
0.0000 ug/min | INTRAVENOUS | Status: DC
  Administered 2018-12-14: 5 ug/min via INTRAVENOUS
  Filled 2018-12-14: qty 250

## 2018-12-14 MED ORDER — FAMOTIDINE 40 MG/5ML PO SUSR
20.0000 mg | Freq: Two times a day (BID) | ORAL | Status: DC
  Administered 2018-12-14 – 2018-12-19 (×11): 20 mg
  Filled 2018-12-14 (×11): qty 2.5

## 2018-12-14 MED ORDER — INSULIN ASPART 100 UNIT/ML ~~LOC~~ SOLN
0.0000 [IU] | SUBCUTANEOUS | Status: DC
  Administered 2018-12-14 (×3): 15 [IU] via SUBCUTANEOUS
  Administered 2018-12-15: 11 [IU] via SUBCUTANEOUS
  Administered 2018-12-15: 15 [IU] via SUBCUTANEOUS
  Administered 2018-12-15 (×2): 11 [IU] via SUBCUTANEOUS
  Administered 2018-12-15 – 2018-12-16 (×3): 15 [IU] via SUBCUTANEOUS
  Administered 2018-12-16: 7 [IU] via SUBCUTANEOUS
  Administered 2018-12-16 (×3): 11 [IU] via SUBCUTANEOUS
  Administered 2018-12-17: 15 [IU] via SUBCUTANEOUS
  Administered 2018-12-17: 08:00:00 11 [IU] via SUBCUTANEOUS
  Administered 2018-12-17: 04:00:00 7 [IU] via SUBCUTANEOUS

## 2018-12-14 MED ORDER — INSULIN ASPART 100 UNIT/ML ~~LOC~~ SOLN
0.0000 [IU] | SUBCUTANEOUS | Status: DC
  Administered 2018-12-14: 2 [IU] via SUBCUTANEOUS
  Administered 2018-12-14: 16 [IU] via SUBCUTANEOUS

## 2018-12-14 NOTE — Progress Notes (Addendum)
PHARMACY - PHYSICIAN COMMUNICATION CRITICAL VALUE ALERT - BLOOD CULTURE IDENTIFICATION (BCID)  Nicole Lee is an 63 y.o. female who presented to [facility] on 12/02/2018 s/p cardiac arrest with possible sepsis  Assessment:  1/2 blood cultures growing MR-CoNS  Name of physician (or Provider) Contacted: NA  Current antibiotics:   Vancomycin and Zosyn   Changes to prescribed antibiotics recommended:   Likely contaminant--no changes recommended as already receiving appropriate antibiotics.  Results for orders placed or performed during the hospital encounter of 11/16/2018  Blood Culture ID Panel (Reflexed) (Collected: 12/04/2018  8:43 AM)  Result Value Ref Range   Enterococcus species NOT DETECTED NOT DETECTED   Listeria monocytogenes NOT DETECTED NOT DETECTED   Staphylococcus species DETECTED (A) NOT DETECTED   Staphylococcus aureus (BCID) NOT DETECTED NOT DETECTED   Methicillin resistance DETECTED (A) NOT DETECTED   Streptococcus species NOT DETECTED NOT DETECTED   Streptococcus agalactiae NOT DETECTED NOT DETECTED   Streptococcus pneumoniae NOT DETECTED NOT DETECTED   Streptococcus pyogenes NOT DETECTED NOT DETECTED   Acinetobacter baumannii NOT DETECTED NOT DETECTED   Enterobacteriaceae species NOT DETECTED NOT DETECTED   Enterobacter cloacae complex NOT DETECTED NOT DETECTED   Escherichia coli NOT DETECTED NOT DETECTED   Klebsiella oxytoca NOT DETECTED NOT DETECTED   Klebsiella pneumoniae NOT DETECTED NOT DETECTED   Proteus species NOT DETECTED NOT DETECTED   Serratia marcescens NOT DETECTED NOT DETECTED   Haemophilus influenzae NOT DETECTED NOT DETECTED   Neisseria meningitidis NOT DETECTED NOT DETECTED   Pseudomonas aeruginosa NOT DETECTED NOT DETECTED   Candida albicans NOT DETECTED NOT DETECTED   Candida glabrata NOT DETECTED NOT DETECTED   Candida krusei NOT DETECTED NOT DETECTED   Candida parapsilosis NOT DETECTED NOT DETECTED   Candida tropicalis NOT DETECTED  NOT DETECTED    Eddie Candle 12/14/2018  7:00 AM

## 2018-12-14 NOTE — Progress Notes (Signed)
EEG complete - results pending 

## 2018-12-14 NOTE — Procedures (Signed)
ELECTROENCEPHALOGRAM REPORT   Patient: Nicole Lee       Room #: [facility] EEG No. ID: [id number] Age: 63 y.o.        Sex: female Referring Physician: Icard Report Date:  12/14/2018        Interpreting Physician: Thana Farr  History: [name] is an 63 y.o. female s/p arrest  Medications:  ASA, Pepcid, Lasix, Insulin, Lopressor, Zosyn, Vancomycin, Fentanyl, Nitroglycerin  Conditions of Recording:  This is a 21 channel routine scalp EEG performed with bipolar and monopolar montages arranged in accordance to the international 10/20 system of electrode placement. One channel was dedicated to EKG recording.  The patient is in the intubated and sedated state.  Description:  The background activity is discontinuous.  It consists of periods of attenuation alternating with short low voltage periods of polymorphic delta activity.  The periods of attenuation are synchronous between the hemispheres and last 2-3 seconds.  The periods of polymorphic delta activity are of higher voltage and last 1-2 seconds.  This activity is persistent throughout the recording.   The patient is stimulated during the study with no change in background rhythm noted.  Hyperventilation and intermittent photic stimulation were not performed.  IMPRESSION: This is an abnormal electroencephalogram secondary to a slow discontinuous background.  Although this may be seen with a diffuse cerebral disturbance, can not rule out a medication effect in the differential as well.  There is no evidence of electrographic seizure activity during this study.     Thana Farr, MD Neurology [phone] 12/14/2018, 4:39 PM

## 2018-12-14 NOTE — Consult Note (Signed)
Neurology Consultation  Reason for Consult: prognostication s/p cardiac arrest Referring Physician: DR. ICARD  CC: Status post cardiac arrest-altered mental status  History is obtained from: Patient unable to provide, chart review  HPI: Nicole Lee is a 64 y.o. female past medical history of COPD, diabetes, hypertension, hyperlipidemia, coronary artery disease status post PCI had a witnessed cardiac arrest when she collapsed at home.  CPR was started at home and King's airway was placed in the field with a total downtime of at least 18 minutes. There were 3 defibrillations in the documentation of the HPI and she received 3 rounds of epinephrine prior to return spontaneous circulation. Brought into [facility], ER where she had glucose greater than 1000 along with hypotension and hyperkalemia.  Not a candidate for TTM protocol due to metabolic disturbance and documented out of window and other etiology most likely underlying cause. Neurology was consulted because of persistent altered mental status nearly 24 hours after the event as well as question of 2 or 3 episodes of possible myoclonus.  According to the nurse, patient had an episode of myoclonus yesterday that resolved with a bolus of fentanyl.  She had 2 more episodes of brief eye twitching/eyebrow twitching myoclonus this morning requiring a dose of fentanyl bolus.  No benzodiazepines or antiepileptics were given.  XAJ:OINOMV to obtain due to altered mental status.   Past Medical History:  Diagnosis Date  . Cardiac arrest ([place]) 11/29/2018  . Chronic systolic CHF (congestive heart failure) ([place]) 09/2017   EF 25-30% by echo at [facility], Cardiologist Dr Bishop Limbo  . COPD (chronic obstructive pulmonary disease) ([place])   . Depression   . Fluid retention   . GERD (gastroesophageal reflux disease)   . Heart attack ([place])    x7  . Hyperlipidemia   . Hypertension   . Insulin dependent diabetes mellitus ([place])   .  Neuropathy   . Vertigo    No family history on file. Unable to obtain due to patient's mentation.  No family at the bedside at this time  Social History:   has no history on file for tobacco, alcohol, and drug. Unable to provide at this time due to mentation.  No family at bedside  Medications  Current Facility-Administered Medications:  .  acetaminophen (TYLENOL) tablet 650 mg, 650 mg, Oral, Q4H PRN, Parrett, Tammy S, NP, 650 mg at 12/14/18 1102 .  aspirin chewable tablet 81 mg, 81 mg, Oral, Daily, Icard, Bradley L, DO, 81 mg at 12/14/18 0935 .  chlorhexidine gluconate (MEDLINE KIT) (PERIDEX) 0.12 % solution 15 mL, 15 mL, Mouth Rinse, BID, Icard, Bradley L, DO, 15 mL at 12/14/18 0749 .  Chlorhexidine Gluconate Cloth 2 % PADS 6 each, 6 each, Topical, Daily, Icard, Bradley L, DO, 6 each at 12/03/2018 1631 .  famotidine (PEPCID) 40 MG/5ML suspension 20 mg, 20 mg, Per Tube, BID, Icard, Bradley L, DO, 20 mg at 12/14/18 0934 .  feeding supplement (VITAL HIGH PROTEIN) liquid 1,000 mL, 1,000 mL, Per Tube, Continuous, Icard, Bradley L, DO, Last Rate: 50 mL/hr at 12/14/18 0840, 1,000 mL at 12/14/18 0840 .  fentaNYL (SUBLIMAZE) bolus via infusion 50 mcg, 50 mcg, Intravenous, Q1H PRN, Icard, Bradley L, DO, 50 mcg at 12/14/18 1048 .  fentaNYL 2517mg in NS 2571m(1022mml) infusion-PREMIX, 25-400 mcg/hr, Intravenous, Continuous, Icard, Bradley L, DO, Last Rate: 17.5 mL/hr at 12/14/18 1400, 175 mcg/hr at 12/14/18 1400 .  [START ON 12/15/2018] furosemide (LASIX) injection 20 mg, 20 mg, Intravenous, Daily, Icard,  Bradley L, DO .  heparin injection 5,000 Units, 5,000 Units, Subcutaneous, Q8H, Parrett, Tammy S, NP, 5,000 Units at 12/14/18 1400 .  CBG monitoring, , , Q4H **AND** insulin aspart (novoLOG) injection 0-24 Units, 0-24 Units, Subcutaneous, Q4H, Icard, Bradley L, DO, 16 Units at 12/14/18 1140 .  insulin regular, human (MYXREDLIN) 100 units/ 100 mL infusion, , Intravenous, Continuous, Gareth Morgan,  MD, Stopped at 12/14/18 [phone] .  MEDLINE mouth rinse, 15 mL, Mouth Rinse, 10 times per day, Icard, Bradley L, DO, 15 mL at 12/14/18 1348 .  metoprolol tartrate (LOPRESSOR) tablet 12.5 mg, 12.5 mg, Oral, Q8H, Icard, Bradley L, DO .  nitroGLYCERIN 50 mg in dextrose 5 % 250 mL (0.2 mg/mL) infusion, 0-200 mcg/min, Intravenous, Titrated, Dorothy Spark, MD, Stopped at 12/14/18 1228 .  ondansetron (ZOFRAN) injection 4 mg, 4 mg, Intravenous, Q6H PRN, Parrett, Tammy S, NP .  piperacillin-tazobactam (ZOSYN) IVPB 3.375 g, 3.375 g, Intravenous, Q8H, Romona Curls, RPH, Last Rate: 12.5 mL/hr at 12/14/18 1400 .  [START ON 12/15/2018] vancomycin (VANCOCIN) 1,250 mg in sodium chloride 0.9 % 250 mL IVPB, 1,250 mg, Intravenous, Q48H, Romona Curls, [facility]   Exam: Current vital signs: BP (!) 96/59   Pulse 91   Temp 99.5 F (37.5 C)   Resp (!) 22   Ht '5\' 1"'  (1.549 m)   Wt 93.6 kg   SpO2 98%   BMI 38.99 kg/m  Vital signs in last 24 hours: Temp:  [92.7 F (33.7 C)-100.9 F (38.3 C)] 99.5 F (37.5 C) (03/29 1400) Pulse Rate:  [79-134] 91 (03/29 1400) Resp:  [0-44] 22 (03/29 1400) BP: (96-181)/(59-108) 96/59 (03/29 1400) SpO2:  [92 %-100 %] 98 % (03/29 1400) Arterial Line BP: (91-198)/(50-93) 94/54 (03/29 1400) FiO2 (%):  [40 %-92 %] 40 % (03/29 1400) Weight:  [93.6 kg-97.4 kg] 93.6 kg (03/29 0600) General: Sedated on fentanyl intubated in no apparent distress HEENT: Normocephalic atraumatic endotracheal tube in place LUNGS - Clear to auscultation bilaterally with no wheezes CV - S1S2 RRR, no m/r/g, equal pulses bilaterally. ABDOMEN - Soft, nontender, nondistended with normoactive BS Ext: warm, well perfused, intact peripheral pulses, trace edema bilaterally  NEURO:  Mental status: Patient is sedated on minimal fentanyl, intubated. Nonverbal.  Does not follow any commands.  Does not open eyes to voice or noxious stimulation. Cranial nerves: Pupils are equal round react to light, no forced gaze  or gaze preference, corneal reflexes are present bilaterally, does not blink to threat from either side, facial symmetry difficult to ascertain due to the tube.  Breathing over the ventilator and doll's eyes present. Motor exam: No spontaneous movement.  No movement to noxious stimulation. Sensory exam: As above Coordination and gait cannot be examined.  Labs I have reviewed labs in epic and the results pertinent to this consultation are: CBC    Component Value Date/Time   WBC 9.3 12/14/2018 0403   RBC 4.28 12/14/2018 0403   HGB 10.9 (L) 12/14/2018 0454   HCT 32.0 (L) 12/14/2018 0454   PLT 187 12/14/2018 0403   MCV 82.7 12/14/2018 0403   MCH 25.5 (L) 12/14/2018 0403   MCHC 30.8 12/14/2018 0403   RDW 14.6 12/14/2018 0403   LYMPHSABS 0.8 12/05/2018 1621   MONOABS 0.4 11/20/2018 1621   EOSABS 0.0 12/08/2018 1621   BASOSABS 0.0 12/06/2018 1621    CMP     Component Value Date/Time   NA 138 12/14/2018 0454   K 4.0 12/14/2018 0454   CL 108 12/14/2018  0403   CO2 22 12/14/2018 0403   GLUCOSE 153 (H) 12/14/2018 0403   BUN 12 12/14/2018 0403   CREATININE 0.70 12/14/2018 0403   CALCIUM 7.5 (L) 12/14/2018 0403   PROT 5.7 (L) 12/14/2018 0403   ALBUMIN 2.3 (L) 12/14/2018 0403   AST 66 (H) 12/14/2018 0403   ALT 36 12/14/2018 0403   ALKPHOS 66 12/14/2018 0403   BILITOT 0.5 12/14/2018 0403   GFRNONAA >60 12/14/2018 0403   GFRAA >60 12/14/2018 0403   Imaging I have reviewed the images obtained:  CT-scan of the brain on arrival shows no acute changes.  No evidence of bleed.  No evidence of evolving ischemic stroke or prolonged anoxia.  Assessment: 63 year old man with past history of COPD diabetes hypertension hyperlipidemia coronary artery disease status post PCI came in with a witnessed cardiac arrest with downtime of at least 18 minutes and was also noted to be hyperkalemic and hyperglycemic with DKA versus HHS, remains unresponsive after greater than 24 hours after the arrest with  labs now improved from presentation. Also concern for myoclonus-3 episodes that resolved with fentanyl boluses. Her exam as documented above shows intact brainstem reflexes, but no purposeful higher cortical function. That said, it is too early from a neurological standpoint to provide any prognostication yet. Recommendations are below.  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 -Discussed with Dr. Valeta Harms on the unit  -- Amie Portland, MD [facility] Pager: [phone] If 7pm to 7am, please call on call as listed on AMION.

## 2018-12-14 NOTE — Progress Notes (Signed)
NAME:  Nicole Lee, MRN:  [id number], DOB:  Jan 24, 1956, LOS: 1 ADMISSION DATE:  12/02/2018, CONSULTATION DATE:  11/14/18 REFERRING MD:  EDP Dr. Dalene Seltzer  , CHIEF COMPLAINT:  Cardiac Arrest    Brief History   63 year old female former smoker admitted 328 for witnessed cardiac arrest at home with CPR x10 minutes//initial rhythm PEA, defib and epi x3./ROSC .  Blood sugar greater than 1000 with hyperkalemia.  PCCM to admit  History of present illness   63 year old female former smoker with a known history of COPD, diabetes, hypertension, hyperlipidemia, coronary artery disease with previous multiple cardiac stents.  Morning of 12/10/2018 patient was at home.  Started having some shortness of breath.  She attempted to ambulate to the bathroom and collapsed.  Was found to be in cardiac arrest.  Family member started CPR.  EMS on arrival noted patient was in PEA.  She was given epi x3.  A King airway was placed.  She had a total downtime of 18 minutes.  She did have a defibrillation x3.  According to ER notes.  Patient had a cough with her COPD.  Had not had any known fevers.  Had no known sick contacts or travel.  Family members are without acute illness.  Patient's blood sugar was found to be greater than 1000.  Her potassium was elevated.  Patient son did tell the ER doctor that she had been out of her metformin. In the ER patient was changed from a King airway to oral ETT.  Shortly after intubation patient did have hypotension.  She was started on Levophed briefly.  But blood pressure rebounded and Levophed was stopped.  Her creatinine was 1.78.  Potassium was 7.3.  Bicarb is 13.  Anion gap was 22.  White count was elevated at 23,000.  UA did not show ketones.  Lactate was 6.9.  Patient was started on Vanco and Zosyn empirically.  She was started on a bicarb drip.  She was given calcium in the ER.  Started on insulin drip.  She was given 2 L fluid bolus.  Patient did have some wide-complex QRS.   He was seen by cardiology for consult.  He was placed ice packs. In the ER.  Patient is sedated on the ventilator -unresponsive.    Past Medical History  Diabetes, hyperlipidemia, COPD, coronary artery disease  Significant Hospital Events     Consults:  3/28 cardiology  Procedures:  3/28 ETT  Significant Diagnostic Tests:  3/28 echo>>EF 20-25%, degenerative MV , LV global hypokinesis   Micro Data:  3/28 blood culture>> 3/28 >BC ID panel +staph/MRSA  3/28 MRSA nasal swab >neg  328/urine culture>>  Antimicrobials:  3/28 vancomycin>> 3/28 Zosyn  Interim history/subjective:  Intermittent agitation , mild decorticate/twitching last pm, none this am, increased alertness, no pursposeful movement   Weaning on PS 10/5 for 2 hrs , increased wob /HR , will change back to full support   Low grade fevers, b/p labile , increased with agitation  Cards followed , echo showed EF 20%.   Weaned on insulin drip , AG closed.     Objective   Blood pressure 140/79, pulse (!) 119, temperature (!) 100.4 F (38 C), resp. rate (!) 3, height 5\' 1"  (1.549 m), weight 93.6 kg, SpO2 95 %.    Vent Mode: CPAP;PSV FiO2 (%):  [40 %-60 %] 40 % Set Rate:  [22 bmp] 22 bmp Vt Set:  [420 mL] 420 mL PEEP:  [5 cmH20] 5 cmH20 Pressure Support:  [  10 cmH20] 10 cmH20 Plateau Pressure:  [12 cmH20-24 cmH20] 12 cmH20   Intake/Output Summary (Last 24 hours) at 12/14/2018 3474 Last data filed at 12/14/2018 0900 Gross per 24 hour  Intake 4236.75 ml  Output 3695 ml  Net 541.75 ml   Filed Weights   16-Dec-2018 0852 12-16-18 1445 12/14/18 0600  Weight: 95.3 kg 97.4 kg 93.6 kg    Examination: General: Morbidly obese female, ill-appearing on vent  HENT: AT/[place], MMM, ETT  Lungs: Coarse breath sounds bilaterally on the vent  Cardiovascular: Tachycardic, grade 1 systolic murmur Abdomen: Obese, soft positive bowel sounds, tube feeds  Extremities: Intact, trace edema  Neuro: Sedated, more alert, questionable eye  contact.  Does not follow commands  GU: Foley in place   Resolved Hospital Problem list     Assessment & Plan:  1. Witnessed Cardiac Arrest questionable etiology - in setting of underlying known CAD . NSTEMI  -elevated troponin . Echo with EF at 20% , LV global hypokinesis ( per cards notes slight change from prev)   Plan  Cards following  No Cath at this time .   2. Respiratory failure- intubated to protect airway with cardiac arrest  COPD  BB atx Leveda Anna PNA -LLL   Plan  Repeat CXR -check ETT placement  Vent support  Cont Empiric ABX  VAP  Daily SBT , evaluate for wean   3. . AG Metabolic Acidosis /Lactic Acidosis >resolved  LA 6.9>2.5  , AG 22 >6  Plan  Trend LA  KVO fluids later today as has closed gap and on TF .   4.  DKA -Beta Hydro Acid + (0.62)  BS >1000 on admission , HgA1C 12.5)  Weaned off insulin drip 3/29   Plan  Cont SSI q 4   5. Hyponatremia NA 115 ?resolved  Hyperkalemia   K 7.3 >6.4 s/p insulin/Ca in ER 3/28 >resolved  Hyperphosp 10.7 >resolved  Hypomag  Plan  Tr bmet   Mg replaced  Replace electrolytes as indicated.   6. Acute Kidney Failure   Scr 1.78 on admit >resolved  Scr 3/29 0.79  Plan  Tr bmet   7. Transaminitis - mild  Plan  Tr LFT   8. Leukocytosis /Fever-improving  Possible LLL PNA , BC ID panel +Staph/MRSA  Plan  Empiric ABX w/ Vanc/Zosyn  Tr LA  Follow cx data   Best practice:  Diet: TF  Pain/Anxiety/Delirium protocol (if indicated): Propofol VAP protocol (if indicated): 3/28 DVT prophylaxis: Heparin subcu GI prophylaxis: Pepcid Glucose control: SSI  Mobility: Bedrest Code Status: Full Family Communication: Unavailable Disposition: ICU  Labs   CBC: Recent Labs  Lab 12-16-2018 0843 December 16, 2018 1036 12/16/18 1617 2018/12/16 1621 12/14/18 0403 12/14/18 0454  WBC 23.8*  --   --  13.6* 9.3  --   NEUTROABS 17.7*  --   --  11.9*  --   --   HGB 12.3 13.9 12.2 11.5* 10.9* 10.9*  HCT 45.6 41.0 36.0 38.1 35.4*  32.0*  MCV 92.9  --   --  83.6 82.7  --   PLT 376  --   --  191 187  --     Basic Metabolic Panel: Recent Labs  Lab December 16, 2018 0843  12/16/2018 1539  12-16-2018 1621 16-Dec-2018 2103 12/14/18 0155 12/14/18 0403 12/14/18 0454  NA 115*   < >  --    < > 134* 131* 135 136 138  K 7.3*   < >  --    < > 3.3*  3.3* 3.9 4.0 4.0  CL 80*  --   --   --  106 105 105 108  --   CO2 13*  --   --   --  21* 19* 21* 22  --   GLUCOSE 1,112*  --   --   --  292* 208* 163* 153*  --   BUN 18  --   --   --  12 13 13 12   --   CREATININE 1.78*  --   --   --  0.76 0.66 0.68 0.70  --   CALCIUM 8.6*  --   --   --  7.4* 7.3* 7.6* 7.5*  --   MG 2.0  --  0.5*  --   --   --   --  1.9  --   PHOS 10.7*  --  3.1  --   --   --   --  2.8  --    < > = values in this interval not displayed.   GFR: Estimated Creatinine Clearance: 74.1 mL/min (by C-G formula based on SCr of 0.7 mg/dL). Recent Labs  Lab 12/10/2018 0843 11/22/2018 1104 12/05/2018 1539 11/23/2018 1621 12/14/18 0403  PROCALCITON <0.10  --   --   --   --   WBC 23.8*  --   --  13.6* 9.3  LATICACIDVEN  --  6.9* 2.5*  --   --     Liver Function Tests: Recent Labs  Lab 12/11/2018 0843 12/09/2018 2103 12/14/18 0403  AST 71* 76* 66*  ALT 55* 37 36  ALKPHOS 127* 70 66  BILITOT 0.8 0.6 0.5  PROT 7.2 5.7* 5.7*  ALBUMIN 2.9* 2.3* 2.3*   No results for input(s): LIPASE, AMYLASE in the last 168 hours. No results for input(s): AMMONIA in the last 168 hours.  ABG    Component Value Date/Time   PHART 7.320 (L) 12/14/2018 0454   PCO2ART 44.1 12/14/2018 0454   PO2ART 79.0 (L) 12/14/2018 0454   HCO3 22.6 12/14/2018 0454   TCO2 24 12/14/2018 0454   ACIDBASEDEF 3.0 (H) 12/14/2018 0454   O2SAT 94.0 12/14/2018 0454     Coagulation Profile: Recent Labs  Lab 12/05/2018 0843  INR 1.3*    Cardiac Enzymes: Recent Labs  Lab 12/10/2018 1329 12/12/2018 1539 11/18/2018 2103 12/14/18 0155 12/14/18 0731  TROPONINI 3.42* 1.73* 4.86* 5.19* 3.98*    HbA1C: Hgb A1c MFr Bld    Date/Time Value Ref Range Status  11/24/2018 08:43 AM 12.5 (H) 4.8 - 5.6 % Final    Comment:    (NOTE) Pre diabetes:          5.7%-6.4% Diabetes:              >6.4% Glycemic control for   <7.0% adults with diabetes     CBG: Recent Labs  Lab 12/14/18 0304 12/14/18 0353 12/14/18 0449 12/14/18 0557 12/14/18 0649  GLUCAP 159* 156* 145* 133* 119*    Review of Systems:   Unable to review as pt is sedated on vent  Chart review   Past Medical History  She,  has a past medical history of Cardiac arrest (HCC) (12/14/2018), Chronic systolic CHF (congestive heart failure) (HCC) (09/2017), COPD (chronic obstructive pulmonary disease) (HCC), Depression, Fluid retention, GERD (gastroesophageal reflux disease), Heart attack (HCC), Hyperlipidemia, Hypertension, Insulin dependent diabetes mellitus (HCC), Neuropathy, and Vertigo.   Surgical History    Past Surgical History:  Procedure Laterality Date   Cardiac Stents     x11  Social History      Family History   Her family history is not on file.   Allergies Allergies  Allergen Reactions   Imitrex [Sumatriptan] Anaphylaxis    Reported from [facility] from 06/2014   Cephalosporins Hives   Cortisone Hives    Cortisone injections   Propoxyphene Hives   Morphine And Related Anxiety     Home Medications  Prior to Admission medications   Not on File     Critical care time:      Eulalie Speights NP-C  [facility]  [phone]  12/14/2018

## 2018-12-14 NOTE — Progress Notes (Addendum)
Progress Note  Patient Name: Nicole Lee Date of Encounter: 12/14/2018  Primary Cardiologist: Dr Bishop Limbo in [facility]  No primary care provider on file.  Subjective   Intubated, on Fentanyl gtt. Eyes open, some ?decerebrate posturing - mild, no response to stimulus, agitated at times.  Poor UOP overnight, was good earlier in the day, but 15-20 cc/hr overnight  Inpatient Medications    Scheduled Meds: . aspirin  81 mg Oral Daily  . chlorhexidine gluconate (MEDLINE KIT)  15 mL Mouth Rinse BID  . Chlorhexidine Gluconate Cloth  6 each Topical Daily  . famotidine  20 mg Per Tube BID  . heparin  5,000 Units Subcutaneous Q8H  . insulin aspart  0-24 Units Subcutaneous Q4H  . mouth rinse  15 mL Mouth Rinse 10 times per day   Continuous Infusions: . sodium chloride Stopped (12/01/2018 1942)  . dextrose 5 % and 0.45% NaCl Stopped (12/14/18 0421)  . feeding supplement (VITAL HIGH PROTEIN)    . fentaNYL infusion INTRAVENOUS 150 mcg/hr (12/14/18 0800)  . insulin Stopped (12/14/18 0651)  . piperacillin-tazobactam (ZOSYN)  IV 12.5 mL/hr at 12/14/18 0800  . [START ON 12/15/2018] vancomycin     PRN Meds: acetaminophen, fentaNYL, ondansetron (ZOFRAN) IV   Vital Signs    Vitals:   12/14/18 0600 12/14/18 0700 12/14/18 0757 12/14/18 0800  BP:   (!) 179/108 (!) 181/90  Pulse: (!) 107 (!) 104 (!) 130 (!) 123  Resp: (!) 0 15 20 (!) 21  Temp: 99.7 F (37.6 C) 99.9 F (37.7 C)  99.9 F (37.7 C)  TempSrc:    Esophageal  SpO2: 94% 92% 94% 93%  Weight: 93.6 kg     Height:        Intake/Output Summary (Last 24 hours) at 12/14/2018 0806 Last data filed at 12/14/2018 0800 Gross per 24 hour  Intake 4146.6 ml  Output 3670 ml  Net 476.6 ml   Filed Weights   12/01/2018 0852 11/29/2018 1445 12/14/18 0600  Weight: 95.3 kg 97.4 kg 93.6 kg    Telemetry    03/28, SR, 1st deg AVB, LBBB, HR 68 - Personally Reviewed  ECG    SR, mostly ST. PVCs and trigeminy seen, mostly before midnight.  - Personally Reviewed  Physical Exam   General: Well developed, obese, female intubated on the vent. Head: Normocephalic, atraumatic.  Neck: Supple without bruits, JVD not seen elevated, not able to assess 2nd habitus and equipment. Lungs:  Resp regular and unlabored, coarse rales. Heart: RRR, S1, S2, no S3, S4, or murmur; no rub. Abdomen: Soft, non-tender, non-distended with normoactive bowel sounds. No hepatomegaly. No rebound/guarding. No obvious abdominal masses. Extremities: No clubbing, cyanosis, no LE edema. Distal pedal pulses are 2+ bilaterally. Neuro: unresponsive, moves arms at times, ?decerebrate  Labs    Hematology Recent Labs  Lab 11/21/2018 0843  11/25/2018 1621 12/14/18 0403 12/14/18 0454  WBC 23.8*  --  13.6* 9.3  --   RBC 4.91  --  4.56 4.28  --   HGB 12.3   < > 11.5* 10.9* 10.9*  HCT 45.6   < > 38.1 35.4* 32.0*  MCV 92.9  --  83.6 82.7  --   MCH 25.1*  --  25.2* 25.5*  --   MCHC 27.0*  --  30.2 30.8  --   RDW 15.1  --  14.4 14.6  --   PLT 376  --  191 187  --    < > = values in this  interval not displayed.    Chemistry Recent Labs  Lab 12/05/2018 0843  11/30/2018 2103 12/14/18 0155 12/14/18 0403 12/14/18 0454  NA 115*   < > 131* 135 136 138  K 7.3*   < > 3.3* 3.9 4.0 4.0  CL 80*   < > 105 105 108  --   CO2 13*   < > 19* 21* 22  --   GLUCOSE 1,112*   < > 208* 163* 153*  --   BUN 18   < > _0 --   CREATININE 1.78*   < > 0.66 0.68 0.70  --   CALCIUM 8.6*   < > 7.3* 7.6* 7.5*  --   PROT 7.2  --  5.7*  --  5.7*  --   ALBUMIN 2.9*  --  2.3*  --  2.3*  --   AST 71*  --  76*  --  66*  --   ALT 55*  --  37  --  36  --   ALKPHOS 127*  --  70  --  66  --   BILITOT 0.8  --  0.6  --  0.5  --   GFRNONAA 30*   < > >60 >60 >60  --   GFRAA 34*   < > >60 >60 >60  --   ANIONGAP 22*   < > _1 --    < > = values in this interval not displayed.     Cardiac Enzymes Recent Labs  Lab 11/18/2018 1329 11/19/2018 1539 11/25/2018 2103 12/14/18 0155  TROPONINI  3.42* 1.73* 4.86* 5.19*   No results for input(s): TROPIPOC in the last 168 hours.   BNPNo results for input(s): BNP, PROBNP in the last 168 hours.   Radiology    Ct Head Wo Contrast  Result Date: 11/20/2018 CLINICAL DATA:  Cardiac arrest. EXAM: CT HEAD WITHOUT CONTRAST TECHNIQUE: Contiguous axial images were obtained from the base of the skull through the vertex without intravenous contrast. COMPARISON:  None. FINDINGS: Brain: Minimal chronic ischemic white matter disease is noted. No mass effect or midline shift is noted. Ventricular size is within normal limits. There is no evidence of mass lesion, hemorrhage or acute infarction. Vascular: No hyperdense vessel or unexpected calcification. Skull: Normal. Negative for fracture or focal lesion. Sinuses/Orbits: No acute finding. Other: None. IMPRESSION: Minimal chronic ischemic white matter disease. No acute intracranial abnormality seen. Electronically Signed   By: Marijo Conception, M.D.   On: 12/09/2018 14:41   Dg Chest Port 1 View  Result Date: 12/14/2018 CLINICAL DATA:  Respiratory failure EXAM: PORTABLE CHEST 1 VIEW COMPARISON:  11/24/2018 FINDINGS: Endotracheal tube terminates 5 cm above the carina. Left lung base is obscured, some of which may reflect prominent epicardial fat, although a small left pleural effusion is suspected given nonvisualization of the left hemidiaphragm (new from prior). Probable left basilar atelectasis. No frank interstitial edema.  No pneumothorax. Cardiomegaly. Enteric tube courses into the stomach. Defibrillator pads overlying the left hemithorax. Right rib fracture deformities. IMPRESSION: Suspected small left pleural effusion, new. Probable left basilar atelectasis. Endotracheal tube terminates 5 cm above the carina. Additional support apparatus as above. Electronically Signed   By: Julian Hy M.D.   On: 12/14/2018 07:03   Dg Chest Port 1 View  Result Date: 11/29/2018 CLINICAL DATA:  Respiratory failure  EXAM: PORTABLE CHEST 1 VIEW COMPARISON:  11/30/2018 FINDINGS: Endotracheal tube is 3.3 cm above the carina. Nasogastric tube extends  down into the stomach. Mild enlargement of the cardiopericardial silhouette, without appreciable edema. Small amount of fluid in the minor fissure. Multiple right lateral rib fractures. No visible pneumothorax. Thoracic spondylosis. Left upper lateral second rib fracture. IMPRESSION: 1. The endotracheal tube is satisfactorily position. 2. Bilateral rib fractures. 3. Trace fluid in the minor fissure. 4. Mild enlargement of the cardiopericardial silhouette, without edema. Electronically Signed   By: Van Clines M.D.   On: 12/15/2018 16:02   Dg Chest Portable 1 View  Result Date: 12/09/2018 CLINICAL DATA:  Cardiac arrest and status post intubation. EXAM: PORTABLE CHEST 1 VIEW COMPARISON:  None. FINDINGS: The heart is moderately enlarged. Endotracheal tube tip is approximately at the carina. Gastric decompression tube extends below the diaphragm. Lungs demonstrate bibasilar atelectasis, right greater than left. No overt pulmonary edema, pleural fluid or pneumothorax identified. There are multiple rib fractures visible by portable chest x-ray including probable left second, right fourth, right fifth, right sixth and right seventh fractures. IMPRESSION: 1. Endotracheal tube tip likely lies at the carina. There may be benefit in retracting the tube by 1-3 cm. 2. Multiple rib fractures visible by portable chest x-ray, right greater than left. No obvious pneumothorax. 3. Cardiac enlargement and bibasilar atelectasis, right greater than left. Electronically Signed   By: Aletta Edouard M.D.   On: 11/26/2018 09:23   Dg Abd Portable 1 View  Result Date: 11/30/2018 CLINICAL DATA:  Tube placement. EXAM: PORTABLE ABDOMEN - 1 VIEW COMPARISON:  Chest radiograph of same date. FINDINGS: Nasogastric terminates at the body of the stomach. Grossly normal bowel gas pattern, without gross free  intraperitoneal air. IMPRESSION: Nasogastric tube terminating at the body of the stomach. Electronically Signed   By: Abigail Miyamoto M.D.   On: 11/22/2018 09:18     Cardiac Studies   ECHO:  12/15/2018  1. The left ventricle has severely reduced systolic function, with an ejection fraction of 20-25%. The cavity size was normal. Left ventricular diastolic function could not be evaluated due to nondiagnostic images. Left ventrical global hypokinesis  without regional wall motion abnormalities.  2. The right ventricle has normal systolic function. The cavity was normal. There is no increase in right ventricular wall thickness.  3. Left atrial size was mildly dilated.  4. No evidence present in the left atrial appendage.  5. The mitral valve is degenerative. There is moderate mitral annular calcification present.  6. The aortic valve is tricuspid. Mild sclerosis of the aortic valve.  7. No pulmonic valve vegetation visualized.  FINDINGS  Left Ventricle: The left ventricle has severely reduced systolic function, with an ejection fraction of 20-25%. The cavity size was normal. There is no increase in left ventricular wall thickness. Left ventricular diastolic function could not be  evaluated due to nondiagnostic images. Left ventrical global hypokinesis without regional wall motion abnormalities. Right Ventricle: The right ventricle has normal systolic function. The cavity was normal. There is no increase in right ventricular wall thickness. Left Atrium: left atrial size was mildly dilated Left Atrial Appendage: No evidence of a thrombus present in the left atrial appendage. Right Atrium: right atrial size was normal in size. Right atrial pressure is estimated at 3 mmHg. Interatrial Septum: No atrial level shunt detected by color flow Doppler. Pericardium: There is no evidence of pericardial effusion. Mitral Valve: The mitral valve is degenerative in appearance. There is moderate mitral annular  calcification present. Mitral valve regurgitation is trivial by color flow Doppler. Tricuspid Valve: The tricuspid valve is normal in  structure. Tricuspid valve regurgitation is trivial by color flow Doppler. Aortic Valve: The aortic valve is tricuspid Mild sclerosis of the aortic valve. Aortic valve regurgitation was not visualized by color flow Doppler. Pulmonic Valve: The pulmonic valve was normal in structure. Pulmonic valve regurgitation was not assessed by color flow Doppler. No pulmonic valve vegetation visualized. Venous: The inferior vena cava is normal in size with greater than 50% respiratory variability.  Patient Profile     63 y.o. female w/ hx CAD, S-CHF, IDDM, HTN, HLD, Obesity, COPD was admitted 03/28 after cardiac arrest.  Assessment & Plan    1. NSTEMI:  - continue to cycle troponins as they are still increasing.  - EF is 20%, slight change from 1 year ago.  - need to get records from Dr Arcola Jansky office in [facility] tomorrow - at this time, would continue ASA, LFTs have been abnl so no statin right now - discuss w/ MD if heparin should be IV for 24 HR - BP elevated this am, but has been low at times also, MD to review and advise on metoprolol 2.5 mg IV Q 6 H - no cath planned now, pending improvement in general medical condition  Otherwise, per CCM. Records obtained from [facility] are in paper chart. Principal Problem:   Cardiac arrest [facility]) Active Problems:   Non-ST elevation (NSTEMI) myocardial infarction (HCC)   Chronic systolic CHF (congestive heart failure) (HCC)   COPD (chronic obstructive pulmonary disease) (HCC)   Insulin dependent diabetes mellitus ([place])   Hypertension  Signed, Rosaria Ferries , PA-C 8:06 AM 12/14/2018 Pager: [phone]  The patient was seen, examined and discussed with Rosaria Ferries, PA-C and I agree with the above.   63 y.o. female w/ hx CAD, S-CHF, IDDM, HTN, HLD, Obesity, COPD was admitted 03/28 after cardiac arrest. The patient  remains intubated, sedated, troponin is now downtrending from 5.1 -> 3.98, I would continue heparin for at least 48-hour unless there is bleeding.  Patient's blood pressure remains normal, she is in sinus tachycardia, her blood pressure gets significantly elevated when she gets agitated. LVEF 20 to 25% with diffuse hypokinesis, prior echocardiogram is not available. We are awaiting records from Dr Arcola Jansky office in [facility] tomorrow. At this time, would continue ASA, LFTs have been abnl so no statin right now. I would add low-dose metoprolol 12.5 mg p.o. twice daily and start nitroglycerin infusion to be titrated for spikes of hypertension.  We will plan cardiac catheterization based on neurological improvement.  Ena Dawley, MD 12/14/2018

## 2018-12-15 ENCOUNTER — Inpatient Hospital Stay (HOSPITAL_COMMUNITY): Payer: Medicaid Other

## 2018-12-15 LAB — CBC
HEMATOCRIT: 33.2 % — AB (ref 36.0–46.0)
Hemoglobin: 10.1 g/dL — ABNORMAL LOW (ref 12.0–15.0)
MCH: 25.8 pg — ABNORMAL LOW (ref 26.0–34.0)
MCHC: 30.4 g/dL (ref 30.0–36.0)
MCV: 84.9 fL (ref 80.0–100.0)
Platelets: 155 10*3/uL (ref 150–400)
RBC: 3.91 MIL/uL (ref 3.87–5.11)
RDW: 15.2 % (ref 11.5–15.5)
WBC: 8.3 10*3/uL (ref 4.0–10.5)
nRBC: 0 % (ref 0.0–0.2)

## 2018-12-15 LAB — BASIC METABOLIC PANEL
Anion gap: 8 (ref 5–15)
BUN: 19 mg/dL (ref 8–23)
CO2: 23 mmol/L (ref 22–32)
Calcium: 8 mg/dL — ABNORMAL LOW (ref 8.9–10.3)
Chloride: 104 mmol/L (ref 98–111)
Creatinine, Ser: 0.77 mg/dL (ref 0.44–1.00)
GFR calc Af Amer: >60 mL/min (ref >60)
GFR calc non Af Amer: >60 mL/min (ref >60)
Glucose, Bld: 313 mg/dL — ABNORMAL HIGH (ref 70–99)
Potassium: 4.6 mmol/L (ref 3.5–5.1)
Sodium: 135 mmol/L (ref 135–145)

## 2018-12-15 LAB — URINE CULTURE: Culture: 80000 — AB

## 2018-12-15 LAB — GLUCOSE, CAPILLARY
GLUCOSE-CAPILLARY: 283 mg/dL — AB (ref 70–99)
Glucose-Capillary: 329 mg/dL — ABNORMAL HIGH (ref 70–99)
Glucose-Capillary: 295 mg/dL — ABNORMAL HIGH (ref 70–99)
Glucose-Capillary: 334 mg/dL — ABNORMAL HIGH (ref 70–99)
Glucose-Capillary: 272 mg/dL — ABNORMAL HIGH (ref 70–99)
Glucose-Capillary: 265 mg/dL — ABNORMAL HIGH (ref 70–99)

## 2018-12-15 LAB — PHOSPHORUS: Phosphorus: 1.7 mg/dL — ABNORMAL LOW (ref 2.5–4.6)

## 2018-12-15 LAB — MAGNESIUM: Magnesium: 2.2 mg/dL (ref 1.7–2.4)

## 2018-12-15 MED ORDER — ASPIRIN 81 MG PO CHEW
81.0000 mg | CHEWABLE_TABLET | Freq: Every day | ORAL | Status: DC
  Administered 2018-12-15 – 2018-12-16 (×2): 81 mg
  Filled 2018-12-15 (×2): qty 1

## 2018-12-15 MED ORDER — ATORVASTATIN CALCIUM 40 MG PO TABS
40.0000 mg | ORAL_TABLET | Freq: Every day | ORAL | Status: DC
  Administered 2018-12-15: 40 mg via ORAL
  Filled 2018-12-15: qty 1

## 2018-12-15 MED ORDER — DOCUSATE SODIUM 50 MG/5ML PO LIQD
100.0000 mg | Freq: Every day | ORAL | Status: DC
  Administered 2018-12-15 – 2018-12-16 (×2): 100 mg
  Filled 2018-12-15 (×3): qty 10

## 2018-12-15 MED ORDER — CLOPIDOGREL BISULFATE 75 MG PO TABS
75.0000 mg | ORAL_TABLET | Freq: Every day | ORAL | Status: DC
  Administered 2018-12-15: 75 mg via ORAL
  Filled 2018-12-15: qty 1

## 2018-12-15 MED ORDER — ACETAMINOPHEN 325 MG PO TABS
650.0000 mg | ORAL_TABLET | ORAL | Status: DC | PRN
  Administered 2018-12-15 – 2018-12-17 (×6): 650 mg
  Filled 2018-12-15 (×6): qty 2

## 2018-12-15 MED ORDER — INSULIN DETEMIR 100 UNIT/ML ~~LOC~~ SOLN
20.0000 [IU] | Freq: Two times a day (BID) | SUBCUTANEOUS | Status: DC
  Administered 2018-12-15 (×2): 20 [IU] via SUBCUTANEOUS
  Filled 2018-12-15 (×3): qty 0.2

## 2018-12-15 MED ORDER — HYDRALAZINE HCL 20 MG/ML IJ SOLN
10.0000 mg | INTRAMUSCULAR | Status: DC | PRN
  Administered 2018-12-16 – 2018-12-17 (×2): 10 mg via INTRAVENOUS
  Filled 2018-12-15 (×2): qty 1

## 2018-12-15 MED ORDER — METOPROLOL TARTRATE 12.5 MG HALF TABLET
12.5000 mg | ORAL_TABLET | Freq: Three times a day (TID) | ORAL | Status: DC
  Administered 2018-12-15 – 2018-12-19 (×12): 12.5 mg
  Filled 2018-12-15 (×12): qty 1

## 2018-12-15 MED ORDER — IPRATROPIUM-ALBUTEROL 0.5-2.5 (3) MG/3ML IN SOLN: 3.0000 mL | RESPIRATORY_TRACT | Status: DC | PRN

## 2018-12-15 MED ORDER — INSULIN DETEMIR 100 UNIT/ML ~~LOC~~ SOLN
20.0000 [IU] | Freq: Every day | SUBCUTANEOUS | Status: DC
  Administered 2018-12-15: 20 [IU] via SUBCUTANEOUS
  Filled 2018-12-15: qty 0.2

## 2018-12-15 NOTE — Progress Notes (Addendum)
Inpatient Diabetes Program Recommendations  AACE/ADA: New Consensus Statement on Inpatient Glycemic Control (2015)  Target Ranges:  Prepandial:   less than 140 mg/dL      Peak postprandial:   less than 180 mg/dL (1-2 hours)      Critically ill patients:  140 - 180 mg/dL   Lab Results  Component Value Date   GLUCAP 283 (H) 12/15/2018   HGBA1C 12.5 (H) 11/17/2018    Review of Glycemic Control Results for Nicole Lee, Nicole Lee (MRN [id number]) as of 12/15/2018 09:08  Ref. Range 12/14/2018 23:05 12/15/2018 04:16 12/15/2018 07:37  Glucose-Capillary Latest Ref Range: 70 - 99 mg/dL 332 (H) 951 (H) 884 (H)   Diabetes history: DM 2 Outpatient Diabetes medications:  Levemir 60 units bid, Metformin 500 mg bid Current orders for Inpatient glycemic control:  Levemir 20 units bid, Novolog resistant q 4 hours Vital 50 cc/hr Inpatient Diabetes Program Recommendations:    Agree with increase in Levemir.  May also consider adding Novolog tube feed coverage 3 units q 4 hours.    Will follow.   Thanks,  Beryl Meager, RN, BC-ADM Inpatient Diabetes Coordinator Pager [phone] (8a-5p)

## 2018-12-15 NOTE — Progress Notes (Signed)
eLink Physician-Brief Progress Note Patient Name: Nicole Lee DOB: 1956/07/15 MRN: [id number]   Date of Service  12/15/2018  HPI/Events of Note  Hypertension - BP = 163/73 with MAP = 99.  eICU Interventions  Will order: 1. Hydralazine 10 mg IV Q 4 hours PRN SBP > 170 or DBP > 100.      Intervention Category Major Interventions: Hypertension - evaluation and management  Sommer,Steven Eugene 12/15/2018, 10:20 PM

## 2018-12-15 NOTE — Progress Notes (Signed)
NAME:  Nicole Lee, MRN:  [id number], DOB:  Jan 27, 1956, LOS: 2 ADMISSION DATE:  12/03/2018, CONSULTATION DATE:  12/12/2018 REFERRING MD:  EDP Dr. Dalene Seltzer  , CHIEF COMPLAINT:  Cardiac Arrest    Brief History   63 yo female former smoker with PEA/VF arrest with ROSC in 10 min.  Blood sugar was > 1000, hyperkalemia.  Past Medical History  Diabetes, hyperlipidemia, COPD, coronary artery disease  Significant Hospital Events   3/28 Admit, cardiology consulted 3/29 Neuro consulted  Consults:  3/28 cardiology 3/29 neuro   Procedures:  3/28 ETT  Significant Diagnostic Tests:  3/28 echo>>EF 20-25%, degenerative MV , LV global hypokinesis  3/29 EEG >> background slowing  Micro Data:  Blood 3/28 >> GPC >>  Urine 3/28 >> Klebsiella  Antimicrobials:  3/28 vancomycin >> 3/28 Zosyn >>   Interim history/subjective:  Remains on vent.  Objective   Blood pressure (!) 122/57, pulse (!) 103, temperature (!) 101.3 F (38.5 C), temperature source Core, resp. rate (!) 22, height 5\' 1"  (1.549 m), weight 96.7 kg, SpO2 95 %.    Vent Mode: PRVC FiO2 (%):  [40 %-92 %] 40 % Set Rate:  [22 bmp] 22 bmp Vt Set:  [420 mL] 420 mL PEEP:  [5 cmH20] 5 cmH20 Plateau Pressure:  [9 cmH20-18 cmH20] 17 cmH20   Intake/Output Summary (Last 24 hours) at 12/15/2018 0757 Last data filed at 12/15/2018 0400 Gross per 24 hour  Intake 1775.12 ml  Output 990 ml  Net 785.12 ml   Filed Weights   12/07/2018 1445 12/14/18 0600 12/15/18 0500  Weight: 97.4 kg 93.6 kg 96.7 kg    Examination:  General - sedated Eyes - pupils reactive ENT - ETT in place Cardiac - regular rate/rhythm, no murmur Chest - scattered rhonchi Abdomen - soft, non tender, + bowel sounds Extremities - no cyanosis, clubbing, or edema Skin - no rashes Neuro - not following commands  CXR (reviewed by me) - Rt > Lt ATX/ASD and effusions   Resolved Hospital Problem list   Metabolic acidosis, lactic acidosis, DKA, AKI with ATN,  Elevated LFTs from shock  Assessment & Plan:   Acute hypoxic respiratory failure from cardiac arrest and possible aspiration pneumonia. Hx of COPD. Plan - full vent support - f/u CXR  Fever, Klebsiella UTI, Possible aspiration PNA. GPC in blood culture. Plan - continue zosyn - f/u blood cx >> if Coag neg Staph, then likely d/c vanc soon  PEA/VF cardiac arrest. NSTEMI. Acute systolic CHF. Hx of CAD, HLD. Plan - continue ASA, lasix, lopressor, lipitor, plavix - hold outpt aldactone  DM. 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 Plan - RASS goal 0 - neurology consulted  Best practice:  Diet: tube feeds DVT prophylaxis: SQ heparin GI prophylaxis: Pepcid Mobility: Bedrest Code Status: Full Family Communication: Unavailable Disposition: ICU  Labs    CMP Latest Ref Rng & Units 12/15/2018 12/14/2018 12/14/2018  Glucose 70 - 99 mg/dL 034(V) - 425(Z)  BUN 8 - 23 mg/dL 19 - 12  Creatinine 5.63 - 1.00 mg/dL 8.75 - 6.43  Sodium 329 - 145 mmol/L 135 138 136  Potassium 3.5 - 5.1 mmol/L 4.6 4.0 4.0  Chloride 98 - 111 mmol/L 104 - 108  CO2 22 - 32 mmol/L 23 - 22  Calcium 8.9 - 10.3 mg/dL 8.0(L) - 7.5(L)  Total Protein 6.5 - 8.1 g/dL - - 5.7(L)  Total Bilirubin 0.3 -  1.2 mg/dL - - 0.5  Alkaline Phos 38 - 126 U/L - - 66  AST 15 - 41 U/L - - 66(H)  ALT 0 - 44 U/L - - 36   CBC Latest Ref Rng & Units 12/15/2018 12/14/2018 12/14/2018  WBC 4.0 - 10.5 K/uL 8.3 - 9.3  Hemoglobin 12.0 - 15.0 g/dL 10.1(L) 10.9(L) 10.9(L)  Hematocrit 36.0 - 46.0 % 33.2(L) 32.0(L) 35.4(L)  Platelets 150 - 400 K/uL 155 - 187   ABG    Component Value Date/Time   PHART 7.320 (L) 12/14/2018 0454   PCO2ART 44.1 12/14/2018 0454   PO2ART 79.0 (L) 12/14/2018 0454   HCO3 22.6 12/14/2018 0454   TCO2 24 12/14/2018 0454   ACIDBASEDEF 3.0 (H) 12/14/2018 0454   O2SAT 94.0 12/14/2018 0454   CBG (last 3)  Recent Labs    12/14/18  2305 12/15/18 0416 12/15/18 0737  GLUCAP 325* 329* 283*    CC time 31 minutes  Coralyn Helling, MD [facility] Pulmonary/Critical Care 12/15/2018, 8:12 AM

## 2018-12-15 NOTE — Progress Notes (Signed)
eLink Physician-Brief Progress Note Patient Name: Nicole Lee DOB: 1955-10-25 MRN: [id number]   Date of Service  12/15/2018  HPI/Events of Note  HHS now off insulin drip, on high dose SSI still with CBGs 300s  eICU Interventions  Ordered levemir 20 to start tonight.  Patient usually on Levemir 100 BID at home     Intervention Category Major Interventions: Hyperglycemia - active titration of insulin therapy  Darl Pikes 12/15/2018, 12:46 AM

## 2018-12-15 NOTE — Progress Notes (Signed)
Subjective: Sedated on Fentanyl. Tolerated vent weaning for 5 hours yesterday, per RT.   HPI from Dr. Johny Chess note was reviewed:  "Nicole Lee is a 63 y.o. female past medical history of COPD, diabetes, hypertension, hyperlipidemia, coronary artery disease status post PCI had a witnessed cardiac arrest when she collapsed at home.  CPR was started at home and King's airway was placed in the field with a total downtime of at least 18 minutes. There were 3 defibrillations in the documentation of the HPI and she received 3 rounds of epinephrine prior to return spontaneous circulation. Brought into [facility] ER where she had glucose greater than 1000 along with hypotension and hyperkalemia.  Not a candidate for TTM protocol due to metabolic disturbance and documented out of window and other etiology most likely underlying cause. Neurology was consulted because of persistent altered mental status nearly 24 hours after the event as well as question of 2 or 3 episodes of possible myoclonus. According to the nurse, patient had an episode of myoclonus yesterday that resolved with a bolus of fentanyl.  She had 2 more episodes of brief eye twitching/eyebrow twitching myoclonus this morning requiring a dose of fentanyl bolus.  No  benzodiazepines or antiepileptics were given."  Objective: Current vital signs: BP (!) 161/82   Pulse (!) 121   Temp (!) 101.3 F (38.5 C) (Core)   Resp (!) 24   Ht '5\' 1"'  (1.549 m)   Wt 96.7 kg   SpO2 97%   BMI 40.28 kg/m  Vital signs in last 24 hours: Temp:  [99.1 F (37.3 C)-101.5 F (38.6 C)] 101.3 F (38.5 C) (03/30 0736) Pulse Rate:  [88-134] 121 (03/30 0802) Resp:  [0-24] 24 (03/30 0802) BP: (96-177)/(57-82) 161/82 (03/30 0802) SpO2:  [92 %-99 %] 97 % (03/30 0802) Arterial Line BP: (91-198)/(50-93) 141/74 (03/30 0400) FiO2 (%):  [40 %-92 %] 40 % (03/30 0802) Weight:  [96.7 kg] 96.7 kg (03/30 0500)  Intake/Output from previous day: 03/29 0701 - 03/30  0700 In: 1775.1 [I.V.:658.6; NG/GT:950; IV Piggyback:166.5] Out: 990 [Urine:990] Intake/Output this shift: No intake/output data recorded. Nutritional status:  Diet Order            Diet NPO time specified  Diet effective now              Neurologic Exam: Ment: Eyes open with no purposeful responses to any auditory or tactile stimuli.  CN: Pupils 4 mm and unreactive. Weak doll's eye reflex present. Eyes conjugate. No nystagmus or forced gaze deviation. Corneals intact. Cough intact.  Motor: Flaccid tone x 4. No movement of upper extremities to noxious stimuli. Triple flexion response of lower extremities to noxious with briskly upgoing toes.  Sensory. No movement of lower extremities to noxious.  Reflexes: Brisk brachioradialis and patellar reflexes bilaterally.    Lab Results: Results for orders placed or performed during the hospital encounter of 12/09/2018 (from the past 48 hour(s))  APTT     Status: None   Collection Time: 11/27/2018  8:43 AM  Result Value Ref Range   aPTT 33 24 - 36 seconds    Comment: Performed at [facility] [address] [place], [place] [address]  Protime-INR     Status: Abnormal   Collection Time: 12/04/2018  8:43 AM  Result Value Ref Range   Prothrombin Time 15.8 (H) 11.4 - 15.2 seconds   INR 1.3 (H) 0.8 - 1.2    Comment: (NOTE) INR goal varies based on device and  disease states. Performed at [facility], [place] [address] [place], [place] [address]   CBC with Differential     Status: Abnormal   Collection Time: 12/05/2018  8:43 AM  Result Value Ref Range   WBC 23.8 (H) 4.0 - 10.5 K/uL   RBC 4.91 3.87 - 5.11 MIL/uL   Hemoglobin 12.3 12.0 - 15.0 g/dL   HCT 45.6 36.0 - 46.0 %   MCV 92.9 80.0 - 100.0 fL   MCH 25.1 (L) 26.0 - 34.0 pg   MCHC 27.0 (L) 30.0 - 36.0 g/dL   RDW 15.1 11.5 - 15.5 %   Platelets 376 150 - 400 K/uL   nRBC 0.1 0.0 - 0.2 %   Neutrophils Relative % 74 %   Neutro Abs 17.7 (H) 1.7 - 7.7 K/uL   Lymphocytes Relative  12 %   Lymphs Abs 3.0 0.7 - 4.0 K/uL   Monocytes Relative 5 %   Monocytes Absolute 1.1 (H) 0.1 - 1.0 K/uL   Eosinophils Relative 0 %   Eosinophils Absolute 0.1 0.0 - 0.5 K/uL   Basophils Relative 1 %   Basophils Absolute 0.1 0.0 - 0.1 K/uL   WBC Morphology      MODERATE LEFT SHIFT (>5% METAS AND MYELOS,OCC PRO NOTED)   Immature Granulocytes 8 %   Abs Immature Granulocytes 1.96 (H) 0.00 - 0.07 K/uL    Comment: Performed at [facility], [place] [address] [place], [place] [address]  Comprehensive metabolic panel     Status: Abnormal   Collection Time: 12/01/2018  8:43 AM  Result Value Ref Range   Sodium 115 (LL) 135 - 145 mmol/L    Comment: CRITICAL RESULT CALLED TO, READ BACK BY AND VERIFIED WITH: SCOTT [facility] RN AT 1020 11/21/2018 BY WOOLLENK    Potassium 7.3 (HH) 3.5 - 5.1 mmol/L    Comment: CRITICAL RESULT CALLED TO, READ BACK BY AND VERIFIED WITH: SCOTT [facility] RN AT 1020 12/11/2018 BY WOOLLENK NO VISIBLE HEMOLYSIS    Chloride 80 (L) 98 - 111 mmol/L   CO2 13 (L) 22 - 32 mmol/L   Glucose, Bld 1,112 (HH) 70 - 99 mg/dL    Comment: CRITICAL RESULT CALLED TO, READ BACK BY AND VERIFIED WITH: SCOTT [facility] RN AT 1020 11/25/2018 BY WOOLLENK    BUN 18 8 - 23 mg/dL   Creatinine, Ser 1.78 (H) 0.44 - 1.00 mg/dL   Calcium 8.6 (L) 8.9 - 10.3 mg/dL   Total Protein 7.2 6.5 - 8.1 g/dL   Albumin 2.9 (L) 3.5 - 5.0 g/dL   AST 71 (H) 15 - 41 U/L   ALT 55 (H) 0 - 44 U/L   Alkaline Phosphatase 127 (H) 38 - 126 U/L   Total Bilirubin 0.8 0.3 - 1.2 mg/dL   GFR calc non Af Amer 30 (L) >60 mL/min   GFR calc Af Amer 34 (L) >60 mL/min   Anion gap 22 (H) 5 - 15    Comment: REPEATED TO VERIFY Performed at [facility], [address] [place], [place] [address]   Magnesium     Status: None   Collection Time: 11/25/2018  8:43 AM  Result Value Ref Range   Magnesium 2.0 1.7 - 2.4 mg/dL    Comment: Performed at [facility] [address], [place], [place] [address]  Phosphorus      Status: Abnormal   Collection Time: 11/16/2018  8:43 AM  Result Value Ref Range   Phosphorus 10.7 (H)  2.5 - 4.6 mg/dL    Comment: Performed at [facility], [place] [address], [place], [place] [address]  Blood culture (routine x 2)     Status: None (Preliminary result)   Collection Time: 12/09/2018  8:43 AM  Result Value Ref Range   Specimen Description BLOOD LEFT ANTECUBITAL    Special Requests      BOTTLES DRAWN AEROBIC ONLY Blood Culture adequate volume   Culture  Setup Time      GRAM POSITIVE COCCI AEROBIC BOTTLE ONLY CRITICAL RESULT CALLED TO, READ BACK BY AND VERIFIED WITH: PHARMD G ABBOTT [id number] AT 660 AM BY CM    Culture      NO GROWTH 1 DAY Performed at [facility], [place] [address] [place], [place] [address]    Report Status PENDING   HIV antibody (Routine Testing)     Status: None   Collection Time: 12/03/2018  8:43 AM  Result Value Ref Range   HIV Screen 4th Generation wRfx Non Reactive Non Reactive    Comment: (NOTE) Performed At: [facility] [place], [place] [id number] Rush Farmer MD DD:[id number]   Procalcitonin     Status: None   Collection Time: 12/10/2018  8:43 AM  Result Value Ref Range   Procalcitonin <0.10 ng/mL    Comment:        Interpretation: PCT (Procalcitonin) <= 0.5 ng/mL: Systemic infection (sepsis) is not likely. Local bacterial infection is possible. (NOTE)       Sepsis PCT Algorithm           Lower Respiratory Tract                                      Infection PCT Algorithm    ----------------------------     ----------------------------         PCT < 0.25 ng/mL                PCT < 0.10 ng/mL         Strongly encourage             Strongly discourage   discontinuation of antibiotics    initiation of antibiotics    ----------------------------     -----------------------------       PCT 0.25 - 0.50 ng/mL            PCT 0.10 - 0.25 ng/mL               OR       >80% decrease in PCT            Discourage  initiation of                                            antibiotics      Encourage discontinuation           of antibiotics    ----------------------------     -----------------------------         PCT >= 0.50 ng/mL              PCT 0.26 - 0.50 ng/mL               AND        <80% decrease in PCT  Encourage initiation of                                             antibiotics       Encourage continuation           of antibiotics    ----------------------------     -----------------------------        PCT >= 0.50 ng/mL                  PCT > 0.50 ng/mL               AND         increase in PCT                  Strongly encourage                                      initiation of antibiotics    Strongly encourage escalation           of antibiotics                                     -----------------------------                                           PCT <= 0.25 ng/mL                                                 OR                                        > 80% decrease in PCT                                     Discontinue / Do not initiate                                             antibiotics Performed at [facility], [address], [place], [place] [address]   Hemoglobin A1c     Status: Abnormal   Collection Time: 11/27/2018  8:43 AM  Result Value Ref Range   Hgb A1c MFr Bld 12.5 (H) 4.8 - 5.6 %    Comment: (NOTE) Pre diabetes:          5.7%-6.4% Diabetes:              >6.4% Glycemic control for   <7.0% adults with diabetes    Mean Plasma Glucose 312.05 mg/dL    Comment: Performed at [facility] [address] [place], [place] [address]  Blood Culture ID Panel (Reflexed)     Status: Abnormal  Collection Time: 11/28/2018  8:43 AM  Result Value Ref Range   Enterococcus species NOT DETECTED NOT DETECTED   Listeria monocytogenes NOT DETECTED NOT DETECTED   Staphylococcus species DETECTED (A) NOT DETECTED    Comment: Methicillin (oxacillin)  resistant coagulase negative staphylococcus. Possible blood culture contaminant (unless isolated from more than one blood culture draw or clinical case suggests pathogenicity). No antibiotic treatment is indicated for blood  culture contaminants. CRITICAL RESULT CALLED TO, READ BACK BY AND VERIFIED WITH: PHARMD [id number] AT 562 AM BY CM    Staphylococcus aureus (BCID) NOT DETECTED NOT DETECTED   Methicillin resistance DETECTED (A) NOT DETECTED    Comment: CRITICAL RESULT CALLED TO, READ BACK BY AND VERIFIED WITH: PHARMD [id number] AT 784 AM BY CM    Streptococcus species NOT DETECTED NOT DETECTED   Streptococcus agalactiae NOT DETECTED NOT DETECTED   Streptococcus pneumoniae NOT DETECTED NOT DETECTED   Streptococcus pyogenes NOT DETECTED NOT DETECTED   Acinetobacter baumannii NOT DETECTED NOT DETECTED   Enterobacteriaceae species NOT DETECTED NOT DETECTED   Enterobacter cloacae complex NOT DETECTED NOT DETECTED   Escherichia coli NOT DETECTED NOT DETECTED   Klebsiella oxytoca NOT DETECTED NOT DETECTED   Klebsiella pneumoniae NOT DETECTED NOT DETECTED   Proteus species NOT DETECTED NOT DETECTED   Serratia marcescens NOT DETECTED NOT DETECTED   Haemophilus influenzae NOT DETECTED NOT DETECTED   Neisseria meningitidis NOT DETECTED NOT DETECTED   Pseudomonas aeruginosa NOT DETECTED NOT DETECTED   Candida albicans NOT DETECTED NOT DETECTED   Candida glabrata NOT DETECTED NOT DETECTED   Candida krusei NOT DETECTED NOT DETECTED   Candida parapsilosis NOT DETECTED NOT DETECTED   Candida tropicalis NOT DETECTED NOT DETECTED    Comment: Performed at [facility], [address], [place], [place] [address]  Urine culture     Status: Abnormal   Collection Time: 11/29/2018  9:28 AM  Result Value Ref Range   Specimen Description URINE, CLEAN CATCH    Special Requests      NONE Performed at [facility], [place] [address], [place], [place] [address]    Culture 80,000 COLONIES/mL KLEBSIELLA  PNEUMONIAE (A)    Report Status 12/15/2018 FINAL    Organism ID, Bacteria KLEBSIELLA PNEUMONIAE (A)       Susceptibility   Klebsiella pneumoniae - MIC*    AMPICILLIN >=32 RESISTANT Resistant     CEFAZOLIN 16 SENSITIVE Sensitive     CEFTRIAXONE <=1 SENSITIVE Sensitive     CIPROFLOXACIN <=0.25 SENSITIVE Sensitive     GENTAMICIN <=1 SENSITIVE Sensitive     IMIPENEM <=0.25 SENSITIVE Sensitive     NITROFURANTOIN 64 INTERMEDIATE Intermediate     TRIMETH/SULFA >=320 RESISTANT Resistant     AMPICILLIN/SULBACTAM >=32 RESISTANT Resistant     PIP/TAZO <=4 SENSITIVE Sensitive     Extended ESBL NEGATIVE Sensitive     * 80,000 COLONIES/mL KLEBSIELLA PNEUMONIAE  Strep pneumoniae urinary antigen  (not at [facility])     Status: None   Collection Time: 11/26/2018  9:28 AM  Result Value Ref Range   Strep Pneumo Urinary Antigen NEGATIVE NEGATIVE    Comment:        Infection due to S. pneumoniae cannot be absolutely ruled out since the antigen present may be below the detection limit of the test. Performed at [facility], [address] [place], [place] [address]   Legionella Pneumophila Serogp 1 Ur Ag     Status: None   Collection Time: 12/11/2018  9:28 AM  Result Value Ref Range   L. pneumophila Serogp 1 Ur Ag Negative Negative    Comment: (NOTE) Presumptive negative for L. pneumophila serogroup 1 antigen in urine, suggesting no recent or current infection. Legionnaires' disease cannot be ruled out since other serogroups and species may also cause disease. Performed At: [facility] [place], [place] [id number] Rush Farmer MD OF:[id number]    Source of Sample URINE, RANDOM     Comment: Performed at [facility], [place] [address], [place], [place] [address]  Urinalysis, Routine w reflex microscopic     Status: Abnormal   Collection Time: 11/30/2018  9:31 AM  Result Value Ref Range   Color, Urine YELLOW YELLOW   APPearance CLEAR CLEAR   Specific Gravity, Urine  1.022 1.005 - 1.030   pH 6.0 5.0 - 8.0   Glucose, UA >=500 (A) NEGATIVE mg/dL   Hgb urine dipstick MODERATE (A) NEGATIVE   Bilirubin Urine NEGATIVE NEGATIVE   Ketones, ur NEGATIVE NEGATIVE mg/dL   Protein, ur 30 (A) NEGATIVE mg/dL   Nitrite NEGATIVE NEGATIVE   Leukocytes,Ua NEGATIVE NEGATIVE   RBC / HPF 0-5 0 - 5 RBC/hpf   WBC, UA 6-10 0 - 5 WBC/hpf   Bacteria, UA RARE (A) NONE SEEN   Squamous Epithelial / LPF 0-5 0 - 5    Comment: Performed at [facility], [place] [address], [place], [place] [address]  CBG monitoring, ED     Status: Abnormal   Collection Time: 12/06/2018  9:36 AM  Result Value Ref Range   Glucose-Capillary >600 (HH) 70 - 99 mg/dL   Comment 1 Notify RN    Comment 2 Document in Chart   I-STAT 7, (LYTES, BLD GAS, ICA, H+H)     Status: Abnormal   Collection Time: 12/04/2018 10:36 AM  Result Value Ref Range   pH, Arterial 7.120 (LL) 7.350 - 7.450   pCO2 arterial 47.4 32.0 - 48.0 mmHg   pO2, Arterial 234.0 (H) 83.0 - 108.0 mmHg   Bicarbonate 15.4 (L) 20.0 - 28.0 mmol/L   TCO2 17 (L) 22 - 32 mmol/L   O2 Saturation 100.0 %   Acid-base deficit 14.0 (H) 0.0 - 2.0 mmol/L   Sodium 123 (L) 135 - 145 mmol/L   Potassium 6.4 (HH) 3.5 - 5.1 mmol/L   Calcium, Ion 1.12 (L) 1.15 - 1.40 mmol/L   HCT 41.0 36.0 - 46.0 %   Hemoglobin 13.9 12.0 - 15.0 g/dL   Patient temperature 98.6 F    Collection site RADIAL, ALLEN'S TEST ACCEPTABLE    Drawn by RT    Sample type ARTERIAL    Comment NOTIFIED PHYSICIAN   Blood culture (routine x 2)     Status: None (Preliminary result)   Collection Time: 12/05/2018 10:59 AM  Result Value Ref Range   Specimen Description BLOOD RIGHT ARM    Special Requests      BOTTLES DRAWN AEROBIC AND ANAEROBIC Blood Culture adequate volume   Culture      NO GROWTH 1 DAY Performed at [facility], [address] [place], [place] [address]    Report Status PENDING   Lactic acid, plasma     Status: Abnormal   Collection Time: 11/29/2018 11:04 AM   Result Value Ref Range   Lactic Acid, Venous 6.9 (HH) 0.5 - 1.9 mmol/L    Comment: CRITICAL RESULT CALLED TO, READ BACK BY AND VERIFIED WITH: Rayford Halsted RN AT 1141 11/29/2018 BY  [facility] Performed at [facility], [place] [address], [place], [place] [address]   CBG monitoring, ED     Status: Abnormal   Collection Time: 11/25/2018 12:35 PM  Result Value Ref Range   Glucose-Capillary >600 (HH) 70 - 99 mg/dL  Troponin I - ONCE - STAT     Status: Abnormal   Collection Time: 12/02/2018  1:29 PM  Result Value Ref Range   Troponin I 3.42 (HH) <0.03 ng/mL    Comment: CRITICAL RESULT CALLED TO, READ BACK BY AND VERIFIED WITH: Rayford Halsted RN AT 7116 11/18/2018 BY [facility] Performed at [facility], [place] [address] [place], [place] [address]   Beta-hydroxybutyric acid     Status: Abnormal   Collection Time: 11/30/2018  1:29 PM  Result Value Ref Range   Beta-Hydroxybutyric Acid 0.62 (H) 0.05 - 0.27 mmol/L    Comment: Performed at [facility] [address] [place], [place] [address]  CBG monitoring, ED     Status: Abnormal   Collection Time: 11/24/2018  2:00 PM  Result Value Ref Range   Glucose-Capillary 589 (HH) 70 - 99 mg/dL   Comment 1 Notify RN   Glucose, capillary     Status: Abnormal   Collection Time: 11/21/2018  3:05 PM  Result Value Ref Range   Glucose-Capillary 540 (HH) 70 - 99 mg/dL  Lactic acid, plasma     Status: Abnormal   Collection Time: 12/05/2018  3:39 PM  Result Value Ref Range   Lactic Acid, Venous 2.5 (HH) 0.5 - 1.9 mmol/L    Comment: CRITICAL RESULT CALLED TO, READ BACK BY AND VERIFIED WITH: Wilhemina Cash RN @ 3291 ON 11/21/2018 BY TEMOCHE, H Performed at [facility], [place] [address] [place], [place] [address]   Troponin I - Now Then Q6H     Status: Abnormal   Collection Time: 12/12/2018  3:39 PM  Result Value Ref Range   Troponin I 1.73 (HH) <0.03 ng/mL    Comment: CRITICAL VALUE NOTED.  VALUE IS CONSISTENT WITH PREVIOUSLY REPORTED AND CALLED  VALUE. Performed at [facility], [place] [address] [place],  [address]   Magnesium     Status: Abnormal   Collection Time: 12/03/2018  3:39 PM  Result Value Ref Range   Magnesium 0.5 (LL) 1.7 - 2.4 mg/dL    Comment: CRITICAL RESULT CALLED TO, READ BACK BY AND VERIFIED WITH: Arther Dames RN @ 9977 ON 12/14/2018 BY TEMOCHE, H Performed at [facility], [place] [address] [place],  [address]   Phosphorus     Status: None   Collection Time: 11/29/2018  3:39 PM  Result Value Ref Range   Phosphorus 3.1 2.5 - 4.6 mg/dL    Comment: Performed at [facility] [address] [place], [place] [address]  Glucose, capillary     Status: Abnormal   Collection Time: 12/01/2018  4:03 PM  Result Value Ref Range   Glucose-Capillary 486 (H) 70 - 99 mg/dL  I-STAT 7, (LYTES, BLD GAS, ICA, H+H)     Status: Abnormal   Collection Time: 12/11/2018  4:17 PM  Result Value Ref Range   pH, Arterial 7.229 (L) 7.350 - 7.450   pCO2 arterial 49.8 (H) 32.0 - 48.0 mmHg   pO2, Arterial 329.0 (H) 83.0 - 108.0 mmHg   Bicarbonate 21.5 20.0 - 28.0 mmol/L   TCO2 23 22 - 32 mmol/L   O2 Saturation 100.0 %   Acid-base deficit  7.0 (H) 0.0 - 2.0 mmol/L   Sodium 136 135 - 145 mmol/L   Potassium 3.6 3.5 - 5.1 mmol/L   Calcium, Ion 1.14 (L) 1.15 - 1.40 mmol/L   HCT 36.0 36.0 - 46.0 %   Hemoglobin 12.2 12.0 - 15.0 g/dL   Patient temperature 34.4 C    Collection site ARTERIAL LINE    Drawn by Nurse    Sample type ARTERIAL    Comment NOTIFIED PHYSICIAN   Basic metabolic panel     Status: Abnormal   Collection Time: 12/05/2018  4:21 PM  Result Value Ref Range   Sodium 134 (L) 135 - 145 mmol/L   Potassium 3.3 (L) 3.5 - 5.1 mmol/L   Chloride 106 98 - 111 mmol/L   CO2 21 (L) 22 - 32 mmol/L   Glucose, Bld 292 (H) 70 - 99 mg/dL   BUN 12 8 - 23 mg/dL   Creatinine, Ser 0.76 0.44 - 1.00 mg/dL    Comment: DELTA CHECK NOTED   Calcium 7.4 (L) 8.9 - 10.3 mg/dL   GFR calc non Af Amer >60 >60 mL/min   GFR calc Af  Amer >60 >60 mL/min   Anion gap 7 5 - 15    Comment: Performed at [facility] [address], [place], [place] [address]  CBC with Differential/Platelet     Status: Abnormal   Collection Time: 11/19/2018  4:21 PM  Result Value Ref Range   WBC 13.6 (H) 4.0 - 10.5 K/uL   RBC 4.56 3.87 - 5.11 MIL/uL   Hemoglobin 11.5 (L) 12.0 - 15.0 g/dL   HCT 38.1 36.0 - 46.0 %   MCV 83.6 80.0 - 100.0 fL    Comment: REPEATED TO VERIFY DELTA CHECK NOTED DUE TO CHANGE IN GLUCOSE    MCH 25.2 (L) 26.0 - 34.0 pg   MCHC 30.2 30.0 - 36.0 g/dL   RDW 14.4 11.5 - 15.5 %   Platelets 191 150 - 400 K/uL   nRBC 0.0 0.0 - 0.2 %   Neutrophils Relative % 88 %   Neutro Abs 11.9 (H) 1.7 - 7.7 K/uL   Lymphocytes Relative 6 %   Lymphs Abs 0.8 0.7 - 4.0 K/uL   Monocytes Relative 3 %   Monocytes Absolute 0.4 0.1 - 1.0 K/uL   Eosinophils Relative 0 %   Eosinophils Absolute 0.0 0.0 - 0.5 K/uL   Basophils Relative 0 %   Basophils Absolute 0.0 0.0 - 0.1 K/uL   Immature Granulocytes 3 %   Abs Immature Granulocytes 0.44 (H) 0.00 - 0.07 K/uL    Comment: Performed at [facility], [address], [place], [place] [address]  MRSA PCR Screening     Status: None   Collection Time: 12/07/2018  4:25 PM  Result Value Ref Range   MRSA by PCR NEGATIVE NEGATIVE    Comment:        The GeneXpert MRSA Assay (FDA approved for NASAL specimens only), is one component of a comprehensive MRSA colonization surveillance program. It is not intended to diagnose MRSA infection nor to guide or monitor treatment for MRSA infections. Performed at [facility], [place] [address], [place], [place] [address]   Glucose, capillary     Status: Abnormal   Collection Time: 12/12/2018  5:03 PM  Result Value Ref Range   Glucose-Capillary 463 (H) 70 - 99 mg/dL  Glucose, capillary     Status: Abnormal   Collection Time: 11/29/2018  5:55 PM  Result Value Ref Range   Glucose-Capillary 288 (H) 70 - 99 mg/dL  Glucose, capillary     Status:  Abnormal   Collection Time: 12/04/2018  6:56 PM  Result Value Ref Range   Glucose-Capillary 295 (H) 70 - 99 mg/dL  Glucose, capillary     Status: Abnormal   Collection Time: 12/16/2018  7:57 PM  Result Value Ref Range   Glucose-Capillary 220 (H) 70 - 99 mg/dL   Comment 1 Notify RN    Comment 2 Glucose Stabilizer   Glucose, capillary     Status: Abnormal   Collection Time: 11/21/2018  9:00 PM  Result Value Ref Range   Glucose-Capillary 204 (H) 70 - 99 mg/dL   Comment 1 Notify RN    Comment 2 Glucose Stabilizer   Troponin I - Now Then Q6H     Status: Abnormal   Collection Time: 11/28/2018  9:03 PM  Result Value Ref Range   Troponin I 4.86 (HH) <0.03 ng/mL    Comment: CRITICAL VALUE NOTED.  VALUE IS CONSISTENT WITH PREVIOUSLY REPORTED AND CALLED VALUE. Performed at [facility], [place] [address] [place], [place] [address]   Comprehensive metabolic panel     Status: Abnormal   Collection Time: 11/18/2018  9:03 PM  Result Value Ref Range   Sodium 131 (L) 135 - 145 mmol/L   Potassium 3.3 (L) 3.5 - 5.1 mmol/L   Chloride 105 98 - 111 mmol/L   CO2 19 (L) 22 - 32 mmol/L   Glucose, Bld 208 (H) 70 - 99 mg/dL   BUN 13 8 - 23 mg/dL   Creatinine, Ser 0.66 0.44 - 1.00 mg/dL   Calcium 7.3 (L) 8.9 - 10.3 mg/dL   Total Protein 5.7 (L) 6.5 - 8.1 g/dL   Albumin 2.3 (L) 3.5 - 5.0 g/dL   AST 76 (H) 15 - 41 U/L   ALT 37 0 - 44 U/L   Alkaline Phosphatase 70 38 - 126 U/L   Total Bilirubin 0.6 0.3 - 1.2 mg/dL   GFR calc non Af Amer >60 >60 mL/min   GFR calc Af Amer >60 >60 mL/min   Anion gap 7 5 - 15    Comment: Performed at [facility], [place] [address], [place], [place] [address]  Glucose, capillary     Status: Abnormal   Collection Time: 12/10/2018  9:53 PM  Result Value Ref Range   Glucose-Capillary 186 (H) 70 - 99 mg/dL   Comment 1 Notify RN    Comment 2 Glucose Stabilizer   Glucose, capillary     Status: Abnormal   Collection Time: 12/03/2018 10:50 PM  Result Value Ref Range    Glucose-Capillary 168 (H) 70 - 99 mg/dL   Comment 1 Notify RN    Comment 2 Glucose Stabilizer   Glucose, capillary     Status: Abnormal   Collection Time: 11/19/2018 11:54 PM  Result Value Ref Range   Glucose-Capillary 152 (H) 70 - 99 mg/dL   Comment 1 Notify RN    Comment 2 Glucose Stabilizer   Glucose, capillary     Status: Abnormal   Collection Time: 12/14/18 12:57 AM  Result Value Ref Range   Glucose-Capillary 148 (H) 70 - 99 mg/dL   Comment 1 Notify RN    Comment 2 Glucose Stabilizer   Basic metabolic panel     Status: Abnormal   Collection Time: 12/14/18  1:55 AM  Result Value Ref Range   Sodium 135 135 - 145  mmol/L   Potassium 3.9 3.5 - 5.1 mmol/L   Chloride 105 98 - 111 mmol/L   CO2 21 (L) 22 - 32 mmol/L   Glucose, Bld 163 (H) 70 - 99 mg/dL   BUN 13 8 - 23 mg/dL   Creatinine, Ser 0.68 0.44 - 1.00 mg/dL   Calcium 7.6 (L) 8.9 - 10.3 mg/dL   GFR calc non Af Amer >60 >60 mL/min   GFR calc Af Amer >60 >60 mL/min   Anion gap 9 5 - 15    Comment: Performed at [facility] [address] [place], [place] [address]  Troponin I - Now Then Q6H     Status: Abnormal   Collection Time: 12/14/18  1:55 AM  Result Value Ref Range   Troponin I 5.19 (HH) <0.03 ng/mL    Comment: CRITICAL VALUE NOTED.  VALUE IS CONSISTENT WITH PREVIOUSLY REPORTED AND CALLED VALUE. Performed at [facility], [place] [address] [place], [place] [address]   Glucose, capillary     Status: Abnormal   Collection Time: 12/14/18  2:00 AM  Result Value Ref Range   Glucose-Capillary 155 (H) 70 - 99 mg/dL   Comment 1 Notify RN    Comment 2 Glucose Stabilizer   Glucose, capillary     Status: Abnormal   Collection Time: 12/14/18  3:04 AM  Result Value Ref Range   Glucose-Capillary 159 (H) 70 - 99 mg/dL   Comment 1 Notify RN    Comment 2 Glucose Stabilizer   Glucose, capillary     Status: Abnormal   Collection Time: 12/14/18  3:53 AM  Result Value Ref Range   Glucose-Capillary 156 (H) 70 - 99 mg/dL    Comment 1 Notify RN    Comment 2 Glucose Stabilizer   CBC     Status: Abnormal   Collection Time: 12/14/18  4:03 AM  Result Value Ref Range   WBC 9.3 4.0 - 10.5 K/uL   RBC 4.28 3.87 - 5.11 MIL/uL   Hemoglobin 10.9 (L) 12.0 - 15.0 g/dL   HCT 35.4 (L) 36.0 - 46.0 %   MCV 82.7 80.0 - 100.0 fL   MCH 25.5 (L) 26.0 - 34.0 pg   MCHC 30.8 30.0 - 36.0 g/dL   RDW 14.6 11.5 - 15.5 %   Platelets 187 150 - 400 K/uL   nRBC 0.0 0.0 - 0.2 %    Comment: Performed at [facility], [place]. [address], [place], [place] [address]  Magnesium     Status: None   Collection Time: 12/14/18  4:03 AM  Result Value Ref Range   Magnesium 1.9 1.7 - 2.4 mg/dL    Comment: Performed at [facility] [address], [place], [place] [address]  Phosphorus     Status: None   Collection Time: 12/14/18  4:03 AM  Result Value Ref Range   Phosphorus 2.8 2.5 - 4.6 mg/dL    Comment: Performed at [facility] [address], [place], [place] [address]  Comprehensive metabolic panel     Status: Abnormal   Collection Time: 12/14/18  4:03 AM  Result Value Ref Range   Sodium 136 135 - 145 mmol/L   Potassium 4.0 3.5 - 5.1 mmol/L   Chloride 108 98 - 111 mmol/L   CO2 22 22 - 32 mmol/L   Glucose, Bld 153 (H) 70 - 99 mg/dL   BUN 12 8 - 23 mg/dL   Creatinine, Ser 0.70 0.44 -  1.00 mg/dL   Calcium 7.5 (L) 8.9 - 10.3 mg/dL   Total Protein 5.7 (L) 6.5 - 8.1 g/dL   Albumin 2.3 (L) 3.5 - 5.0 g/dL   AST 66 (H) 15 - 41 U/L   ALT 36 0 - 44 U/L   Alkaline Phosphatase 66 38 - 126 U/L   Total Bilirubin 0.5 0.3 - 1.2 mg/dL   GFR calc non Af Amer >60 >60 mL/min   GFR calc Af Amer >60 >60 mL/min   Anion gap 6 5 - 15    Comment: Performed at [facility] [address] [place], [place] [address]  Glucose, capillary     Status: Abnormal   Collection Time: 12/14/18  4:49 AM  Result Value Ref Range   Glucose-Capillary 145 (H) 70 - 99 mg/dL   Comment 1 Notify RN    Comment 2 Glucose Stabilizer   I-STAT 7,  (LYTES, BLD GAS, ICA, H+H)     Status: Abnormal   Collection Time: 12/14/18  4:54 AM  Result Value Ref Range   pH, Arterial 7.320 (L) 7.350 - 7.450   pCO2 arterial 44.1 32.0 - 48.0 mmHg   pO2, Arterial 79.0 (L) 83.0 - 108.0 mmHg   Bicarbonate 22.6 20.0 - 28.0 mmol/L   TCO2 24 22 - 32 mmol/L   O2 Saturation 94.0 %   Acid-base deficit 3.0 (H) 0.0 - 2.0 mmol/L   Sodium 138 135 - 145 mmol/L   Potassium 4.0 3.5 - 5.1 mmol/L   Calcium, Ion 1.16 1.15 - 1.40 mmol/L   HCT 32.0 (L) 36.0 - 46.0 %   Hemoglobin 10.9 (L) 12.0 - 15.0 g/dL   Patient temperature 37.4 C    Collection site ARTERIAL LINE    Drawn by Nurse    Sample type ARTERIAL   Glucose, capillary     Status: Abnormal   Collection Time: 12/14/18  5:57 AM  Result Value Ref Range   Glucose-Capillary 133 (H) 70 - 99 mg/dL   Comment 1 Notify RN    Comment 2 Glucose Stabilizer   Glucose, capillary     Status: Abnormal   Collection Time: 12/14/18  6:49 AM  Result Value Ref Range   Glucose-Capillary 119 (H) 70 - 99 mg/dL   Comment 1 Notify RN    Comment 2 Glucose Stabilizer   Troponin I - Now Then Q6H     Status: Abnormal   Collection Time: 12/14/18  7:31 AM  Result Value Ref Range   Troponin I 3.98 (HH) <0.03 ng/mL    Comment: CRITICAL VALUE NOTED.  VALUE IS CONSISTENT WITH PREVIOUSLY REPORTED AND CALLED VALUE. Performed at [facility], [place] [address] [place], [place] [address]   Glucose, capillary     Status: Abnormal   Collection Time: 12/14/18  7:35 AM  Result Value Ref Range   Glucose-Capillary 147 (H) 70 - 99 mg/dL  Glucose, capillary     Status: Abnormal   Collection Time: 12/14/18 11:36 AM  Result Value Ref Range   Glucose-Capillary 318 (H) 70 - 99 mg/dL  Lactic acid, plasma     Status: None   Collection Time: 12/14/18 11:51 AM  Result Value Ref Range   Lactic Acid, Venous 1.1 0.5 - 1.9 mmol/L    Comment: Performed at [facility], [place] [address] [place], [place] [address]  Troponin I - Now Then Q6H      Status: Abnormal   Collection Time: 12/14/18  1:02 PM  Result  Value Ref Range   Troponin I 2.94 (HH) <0.03 ng/mL    Comment: CRITICAL VALUE NOTED.  VALUE IS CONSISTENT WITH PREVIOUSLY REPORTED AND CALLED VALUE. Performed at [facility], [place] [address] [place], [place] [address]   Glucose, capillary     Status: Abnormal   Collection Time: 12/14/18  3:29 PM  Result Value Ref Range   Glucose-Capillary 310 (H) 70 - 99 mg/dL  Magnesium     Status: Abnormal   Collection Time: 12/14/18  4:38 PM  Result Value Ref Range   Magnesium 2.5 (H) 1.7 - 2.4 mg/dL    Comment: Performed at [facility] [address] [place], [place] [address]  Phosphorus     Status: None   Collection Time: 12/14/18  4:38 PM  Result Value Ref Range   Phosphorus 2.5 2.5 - 4.6 mg/dL    Comment: Performed at [facility], [place] [address], [place], [place] [address]  Glucose, capillary     Status: Abnormal   Collection Time: 12/14/18  7:59 PM  Result Value Ref Range   Glucose-Capillary 333 (H) 70 - 99 mg/dL  Troponin I - Now Then Q6H     Status: Abnormal   Collection Time: 12/14/18  8:07 PM  Result Value Ref Range   Troponin I 2.29 (HH) <0.03 ng/mL    Comment: CRITICAL VALUE NOTED.  VALUE IS CONSISTENT WITH PREVIOUSLY REPORTED AND CALLED VALUE. Performed at [facility], [place] [address], [place], [place] [address]   Glucose, capillary     Status: Abnormal   Collection Time: 12/14/18 11:05 PM  Result Value Ref Range   Glucose-Capillary 325 (H) 70 - 99 mg/dL  Glucose, capillary     Status: Abnormal   Collection Time: 12/15/18  4:16 AM  Result Value Ref Range   Glucose-Capillary 329 (H) 70 - 99 mg/dL  Magnesium     Status: None   Collection Time: 12/15/18  5:06 AM  Result Value Ref Range   Magnesium 2.2 1.7 - 2.4 mg/dL    Comment: Performed at [facility], [place] [address], [place], [place] [address]  Phosphorus     Status: Abnormal   Collection Time: 12/15/18  5:06 AM  Result  Value Ref Range   Phosphorus 1.7 (L) 2.5 - 4.6 mg/dL    Comment: Performed at [facility] [address], [place], [place] [address]  CBC     Status: Abnormal   Collection Time: 12/15/18  5:06 AM  Result Value Ref Range   WBC 8.3 4.0 - 10.5 K/uL   RBC 3.91 3.87 - 5.11 MIL/uL   Hemoglobin 10.1 (L) 12.0 - 15.0 g/dL   HCT 33.2 (L) 36.0 - 46.0 %   MCV 84.9 80.0 - 100.0 fL   MCH 25.8 (L) 26.0 - 34.0 pg   MCHC 30.4 30.0 - 36.0 g/dL   RDW 15.2 11.5 - 15.5 %   Platelets 155 150 - 400 K/uL   nRBC 0.0 0.0 - 0.2 %    Comment: Performed at [facility], [place] [address], [place], [place] [address]  Basic metabolic panel     Status: Abnormal   Collection Time: 12/15/18  5:06 AM  Result Value Ref Range   Sodium 135 135 - 145 mmol/L   Potassium 4.6 3.5 - 5.1 mmol/L   Chloride 104 98 - 111 mmol/L   CO2 23 22 - 32 mmol/L   Glucose, Bld 313 (H) 70 -  99 mg/dL   BUN 19 8 - 23 mg/dL   Creatinine, Ser 0.77 0.44 - 1.00 mg/dL   Calcium 8.0 (L) 8.9 - 10.3 mg/dL   GFR calc non Af Amer >60 >60 mL/min   GFR calc Af Amer >60 >60 mL/min   Anion gap 8 5 - 15    Comment: Performed at [facility] [address] [place], [place] [address]  Glucose, capillary     Status: Abnormal   Collection Time: 12/15/18  7:37 AM  Result Value Ref Range   Glucose-Capillary 283 (H) 70 - 99 mg/dL    Recent Results (from the past 240 hour(s))  Blood culture (routine x 2)     Status: None (Preliminary result)   Collection Time: 11/20/2018  8:43 AM  Result Value Ref Range Status   Specimen Description BLOOD LEFT ANTECUBITAL  Final   Special Requests   Final    BOTTLES DRAWN AEROBIC ONLY Blood Culture adequate volume   Culture  Setup Time   Final    GRAM POSITIVE COCCI AEROBIC BOTTLE ONLY CRITICAL RESULT CALLED TO, READ BACK BY AND VERIFIED WITH: PHARMD G ABBOTT [id number] AT 654 AM BY CM    Culture   Final    NO GROWTH 1 DAY Performed at [facility], [place] [address] [place], [place] [address]     Report Status PENDING  Incomplete  Blood Culture ID Panel (Reflexed)     Status: Abnormal   Collection Time: 12/10/2018  8:43 AM  Result Value Ref Range Status   Enterococcus species NOT DETECTED NOT DETECTED Final   Listeria monocytogenes NOT DETECTED NOT DETECTED Final   Staphylococcus species DETECTED (A) NOT DETECTED Final    Comment: Methicillin (oxacillin) resistant coagulase negative staphylococcus. Possible blood culture contaminant (unless isolated from more than one blood culture draw or clinical case suggests pathogenicity). No antibiotic treatment is indicated for blood  culture contaminants. CRITICAL RESULT CALLED TO, READ BACK BY AND VERIFIED WITH: PHARMD [id number] AT 170 AM BY CM    Staphylococcus aureus (BCID) NOT DETECTED NOT DETECTED Final   Methicillin resistance DETECTED (A) NOT DETECTED Final    Comment: CRITICAL RESULT CALLED TO, READ BACK BY AND VERIFIED WITH: PHARMD [id number] AT 496 AM BY CM    Streptococcus species NOT DETECTED NOT DETECTED Final   Streptococcus agalactiae NOT DETECTED NOT DETECTED Final   Streptococcus pneumoniae NOT DETECTED NOT DETECTED Final   Streptococcus pyogenes NOT DETECTED NOT DETECTED Final   Acinetobacter baumannii NOT DETECTED NOT DETECTED Final   Enterobacteriaceae species NOT DETECTED NOT DETECTED Final   Enterobacter cloacae complex NOT DETECTED NOT DETECTED Final   Escherichia coli NOT DETECTED NOT DETECTED Final   Klebsiella oxytoca NOT DETECTED NOT DETECTED Final   Klebsiella pneumoniae NOT DETECTED NOT DETECTED Final   Proteus species NOT DETECTED NOT DETECTED Final   Serratia marcescens NOT DETECTED NOT DETECTED Final   Haemophilus influenzae NOT DETECTED NOT DETECTED Final   Neisseria meningitidis NOT DETECTED NOT DETECTED Final   Pseudomonas aeruginosa NOT DETECTED NOT DETECTED Final   Candida albicans NOT DETECTED NOT DETECTED Final   Candida glabrata NOT DETECTED NOT DETECTED Final   Candida krusei NOT DETECTED NOT  DETECTED Final   Candida parapsilosis NOT DETECTED NOT DETECTED Final   Candida tropicalis NOT DETECTED NOT DETECTED Final    Comment: Performed at [facility], [address], [place], [place] [address]  Urine culture     Status: Abnormal  Collection Time: 12/14/2018  9:28 AM  Result Value Ref Range Status   Specimen Description URINE, CLEAN CATCH  Final   Special Requests   Final    NONE Performed at [facility], [place] [address] [place], [place] [address]    Culture 80,000 COLONIES/mL KLEBSIELLA PNEUMONIAE (A)  Final   Report Status 12/15/2018 FINAL  Final   Organism ID, Bacteria KLEBSIELLA PNEUMONIAE (A)  Final      Susceptibility   Klebsiella pneumoniae - MIC*    AMPICILLIN >=32 RESISTANT Resistant     CEFAZOLIN 16 SENSITIVE Sensitive     CEFTRIAXONE <=1 SENSITIVE Sensitive     CIPROFLOXACIN <=0.25 SENSITIVE Sensitive     GENTAMICIN <=1 SENSITIVE Sensitive     IMIPENEM <=0.25 SENSITIVE Sensitive     NITROFURANTOIN 64 INTERMEDIATE Intermediate     TRIMETH/SULFA >=320 RESISTANT Resistant     AMPICILLIN/SULBACTAM >=32 RESISTANT Resistant     PIP/TAZO <=4 SENSITIVE Sensitive     Extended ESBL NEGATIVE Sensitive     * 80,000 COLONIES/mL KLEBSIELLA PNEUMONIAE  Blood culture (routine x 2)     Status: None (Preliminary result)   Collection Time: 11/18/2018 10:59 AM  Result Value Ref Range Status   Specimen Description BLOOD RIGHT ARM  Final   Special Requests   Final    BOTTLES DRAWN AEROBIC AND ANAEROBIC Blood Culture adequate volume   Culture   Final    NO GROWTH 1 DAY Performed at [facility], [address], [place], [place] [address]    Report Status PENDING  Incomplete  MRSA PCR Screening     Status: None   Collection Time: 11/28/2018  4:25 PM  Result Value Ref Range Status   MRSA by PCR NEGATIVE NEGATIVE Final    Comment:        The GeneXpert MRSA Assay (FDA approved for NASAL specimens only), is one component of a comprehensive MRSA  colonization surveillance program. It is not intended to diagnose MRSA infection nor to guide or monitor treatment for MRSA infections. Performed at [facility], [place] [address], [place], [place] [address]     Lipid Panel No results for input(s): CHOL, TRIG, HDL, CHOLHDL, VLDL, LDLCALC in the last 72 hours.  Studies/Results: Ct Head Wo Contrast  Result Date: 12/03/2018 CLINICAL DATA:  Cardiac arrest. EXAM: CT HEAD WITHOUT CONTRAST TECHNIQUE: Contiguous axial images were obtained from the base of the skull through the vertex without intravenous contrast. COMPARISON:  None. FINDINGS: Brain: Minimal chronic ischemic white matter disease is noted. No mass effect or midline shift is noted. Ventricular size is within normal limits. There is no evidence of mass lesion, hemorrhage or acute infarction. Vascular: No hyperdense vessel or unexpected calcification. Skull: Normal. Negative for fracture or focal lesion. Sinuses/Orbits: No acute finding. Other: None. IMPRESSION: Minimal chronic ischemic white matter disease. No acute intracranial abnormality seen. Electronically Signed   By: Marijo Conception, M.D.   On: 11/30/2018 14:41   Dg Chest Port 1 View  Result Date: 12/14/2018 CLINICAL DATA:  Respiratory failure EXAM: PORTABLE CHEST 1 VIEW COMPARISON:  12/12/2018 FINDINGS: Endotracheal tube terminates 5 cm above the carina. Left lung base is obscured, some of which may reflect prominent epicardial fat, although a small left pleural effusion is suspected given nonvisualization of the left hemidiaphragm (new from prior). Probable left basilar atelectasis. No frank interstitial edema.  No pneumothorax. Cardiomegaly. Enteric tube courses into the stomach. Defibrillator pads overlying the left hemithorax. Right rib fracture  deformities. IMPRESSION: Suspected small left pleural effusion, new. Probable left basilar atelectasis. Endotracheal tube terminates 5 cm above the carina. Additional support apparatus  as above. Electronically Signed   By: Julian Hy M.D.   On: 12/14/2018 07:03   Dg Chest Port 1 View  Result Date: 11/27/2018 CLINICAL DATA:  Respiratory failure EXAM: PORTABLE CHEST 1 VIEW COMPARISON:  12/08/2018 FINDINGS: Endotracheal tube is 3.3 cm above the carina. Nasogastric tube extends down into the stomach. Mild enlargement of the cardiopericardial silhouette, without appreciable edema. Small amount of fluid in the minor fissure. Multiple right lateral rib fractures. No visible pneumothorax. Thoracic spondylosis. Left upper lateral second rib fracture. IMPRESSION: 1. The endotracheal tube is satisfactorily position. 2. Bilateral rib fractures. 3. Trace fluid in the minor fissure. 4. Mild enlargement of the cardiopericardial silhouette, without edema. Electronically Signed   By: Van Clines M.D.   On: 12/16/2018 16:02   Dg Chest Portable 1 View  Result Date: 11/30/2018 CLINICAL DATA:  Cardiac arrest and status post intubation. EXAM: PORTABLE CHEST 1 VIEW COMPARISON:  None. FINDINGS: The heart is moderately enlarged. Endotracheal tube tip is approximately at the carina. Gastric decompression tube extends below the diaphragm. Lungs demonstrate bibasilar atelectasis, right greater than left. No overt pulmonary edema, pleural fluid or pneumothorax identified. There are multiple rib fractures visible by portable chest x-ray including probable left second, right fourth, right fifth, right sixth and right seventh fractures. IMPRESSION: 1. Endotracheal tube tip likely lies at the carina. There may be benefit in retracting the tube by 1-3 cm. 2. Multiple rib fractures visible by portable chest x-ray, right greater than left. No obvious pneumothorax. 3. Cardiac enlargement and bibasilar atelectasis, right greater than left. Electronically Signed   By: Aletta Edouard M.D.   On: 12/06/2018 09:23   Dg Abd Portable 1 View  Result Date: 11/24/2018 CLINICAL DATA:  Tube placement. EXAM: PORTABLE  ABDOMEN - 1 VIEW COMPARISON:  Chest radiograph of same date. FINDINGS: Nasogastric terminates at the body of the stomach. Grossly normal bowel gas pattern, without gross free intraperitoneal air. IMPRESSION: Nasogastric tube terminating at the body of the stomach. Electronically Signed   By: Abigail Miyamoto M.D.   On: 11/23/2018 09:18    Medications:  Scheduled: . aspirin  81 mg Per Tube Daily  . atorvastatin  40 mg Oral Daily  . chlorhexidine gluconate (MEDLINE KIT)  15 mL Mouth Rinse BID  . Chlorhexidine Gluconate Cloth  6 each Topical Daily  . clopidogrel  75 mg Oral Daily  . famotidine  20 mg Per Tube BID  . furosemide  20 mg Intravenous Daily  . heparin  5,000 Units Subcutaneous Q8H  . insulin aspart  0-20 Units Subcutaneous Q4H  . insulin detemir  20 Units Subcutaneous BID  . mouth rinse  15 mL Mouth Rinse 10 times per day  . metoprolol tartrate  12.5 mg Per Tube Q8H   Continuous: . feeding supplement (VITAL HIGH PROTEIN) 1,000 mL (12/15/18 0750)  . fentaNYL infusion INTRAVENOUS 150 mcg/hr (12/15/18 0400)  . piperacillin-tazobactam (ZOSYN)  IV 3.375 g (12/15/18 0501)  . vancomycin      Assessment: 63 year old female with PMHx of COPD, diabetes, HTN, HLD and CAD status post PCI who presented after witnessed cardiac arrest with downtime of at least 18 minutes. Was also noted to be hyperkalemic and hyperglycemic with DKA versus HHS. Continues to be unresponsive today, after greater than 48 hours post-arrest. Neurology was consulted for concern for myoclonus - she had 3 episodes  that resolved with fentanyl boluses. Initial Neurology exam showed intact brainstem reflexes, but no purposeful higher cortical function.  1. Today's exam continues to be most consistent with no purposeful higher cortical function. 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 2. From a neurological standpoint it is still too early to provide any prognostication yet. Will need to reexamine on Tuesday after 1 PM to  provide more accurate prognostication, after 72 hours has elapsed since the cardiac arrest.  3. EEG: This is an abnormal electroencephalogram secondary to a slow discontinuous background.  Although this may be seen with a diffuse cerebral disturbance, can not rule out a medication effect in the differential as well.  There is no evidence of electrographic seizure activity during this study.    Recommendations: - No need for AEDs for now - MRI brain w/o contrast at least 48 hr after arrest. Can obtain today after 1 PM - Neurology will continue to follow  35 minutes spent in the neurological evaluation and management of this critically ill patient   LOS: 2 days   '@Electronically'  signed: Dr. Kerney Elbe 12/15/2018  8:10 AM

## 2018-12-16 ENCOUNTER — Inpatient Hospital Stay (HOSPITAL_COMMUNITY): Payer: Medicaid Other

## 2018-12-16 LAB — CULTURE, BLOOD (ROUTINE X 2): Special Requests: ADEQUATE

## 2018-12-16 LAB — GLUCOSE, CAPILLARY
GLUCOSE-CAPILLARY: 255 mg/dL — AB (ref 70–99)
Glucose-Capillary: 249 mg/dL — ABNORMAL HIGH (ref 70–99)
Glucose-Capillary: 259 mg/dL — ABNORMAL HIGH (ref 70–99)
Glucose-Capillary: 284 mg/dL — ABNORMAL HIGH (ref 70–99)
Glucose-Capillary: 321 mg/dL — ABNORMAL HIGH (ref 70–99)
Glucose-Capillary: 327 mg/dL — ABNORMAL HIGH (ref 70–99)

## 2018-12-16 LAB — CBC
HCT: 33.7 % — ABNORMAL LOW (ref 36.0–46.0)
Hemoglobin: 10.2 g/dL — ABNORMAL LOW (ref 12.0–15.0)
MCH: 25.9 pg — ABNORMAL LOW (ref 26.0–34.0)
MCHC: 30.3 g/dL (ref 30.0–36.0)
MCV: 85.5 fL (ref 80.0–100.0)
NRBC: 0 % (ref 0.0–0.2)
Platelets: 150 10*3/uL (ref 150–400)
RBC: 3.94 MIL/uL (ref 3.87–5.11)
RDW: 15.2 % (ref 11.5–15.5)
WBC: 7.9 10*3/uL (ref 4.0–10.5)

## 2018-12-16 LAB — BASIC METABOLIC PANEL
Anion gap: 9 (ref 5–15)
BUN: 24 mg/dL — ABNORMAL HIGH (ref 8–23)
CO2: 25 mmol/L (ref 22–32)
Calcium: 8.1 mg/dL — ABNORMAL LOW (ref 8.9–10.3)
Chloride: 104 mmol/L (ref 98–111)
Creatinine, Ser: 0.69 mg/dL (ref 0.44–1.00)
GFR calc Af Amer: >60 mL/min (ref >60)
GFR calc non Af Amer: >60 mL/min (ref >60)
Glucose, Bld: 282 mg/dL — ABNORMAL HIGH (ref 70–99)
Potassium: 3.5 mmol/L (ref 3.5–5.1)
Sodium: 138 mmol/L (ref 135–145)

## 2018-12-16 MED ORDER — SODIUM CHLORIDE 0.9 % IV BOLUS
500.0000 mL | Freq: Once | INTRAVENOUS | Status: AC
  Administered 2018-12-16: 500 mL via INTRAVENOUS

## 2018-12-16 MED ORDER — ATORVASTATIN CALCIUM 40 MG PO TABS
40.0000 mg | ORAL_TABLET | Freq: Every day | ORAL | Status: DC
  Administered 2018-12-16: 40 mg
  Filled 2018-12-16: qty 1

## 2018-12-16 MED ORDER — FUROSEMIDE 10 MG/ML IJ SOLN
40.0000 mg | Freq: Every day | INTRAMUSCULAR | Status: DC
  Administered 2018-12-16: 40 mg via INTRAVENOUS
  Filled 2018-12-16: qty 4

## 2018-12-16 MED ORDER — CLOPIDOGREL BISULFATE 75 MG PO TABS
75.0000 mg | ORAL_TABLET | Freq: Every day | ORAL | Status: DC
  Administered 2018-12-16: 75 mg
  Filled 2018-12-16: qty 1

## 2018-12-16 MED ORDER — INSULIN DETEMIR 100 UNIT/ML ~~LOC~~ SOLN
30.0000 [IU] | Freq: Two times a day (BID) | SUBCUTANEOUS | Status: DC
  Administered 2018-12-16 (×2): 30 [IU] via SUBCUTANEOUS
  Filled 2018-12-16 (×5): qty 0.3

## 2018-12-16 NOTE — Progress Notes (Signed)
Pharmacy Antibiotic Note  Nicole Lee is a 63 y.o. female admitted on 12/06/2018 with PEA arrest. Pt started on broad spectrum ABX with concern for sepsis. Urine culture positive for Klebsiella Pneumoniae, vancomycin has been stopped with negative MRSA swab. Blood cultures positive for CoNS (contaminate)   Plan: -Continue Zosyn 3.375g IV EI q8h for now -Can consider transitioning to cefazolin or ceftriaxone soon  Height: 5\' 1"  (154.9 cm) Weight: 212 lb 8.4 oz (96.4 kg) IBW/kg (Calculated) : 47.8  Temp (24hrs), Avg:101 F (38.3 C), Min:99.7 F (37.6 C), Max:101.8 F (38.8 C)  Recent Labs  Lab 12/14/2018 0843 11/28/2018 1104 12/04/2018 1539 12/04/2018 1621 12/07/2018 2103 12/14/18 0155 12/14/18 0403 12/14/18 1151 12/15/18 0506 12/16/18 0334  WBC 23.8*  --   --  13.6*  --   --  9.3  --  8.3 7.9  CREATININE 1.78*  --   --  0.76 0.66 0.68 0.70  --  0.77 0.69  LATICACIDVEN  --  6.9* 2.5*  --   --   --   --  1.1  --   --     Estimated Creatinine Clearance: 75.4 mL/min (by C-G formula based on SCr of 0.69 mg/dL).    Allergies  Allergen Reactions  . Imitrex [Sumatriptan] Anaphylaxis    Reported from [facility] from 06/2014  . Cephalosporins Hives  . Cortisone Hives    Cortisone injections  . Propoxyphene Hives  . Morphine And Related Anxiety    Antimicrobials this admission: 3/28 vancomycin >> 3/30 3/28 zosyn >>    Microbiology results: 3/28 BCx - 1o2 MR-CoNS (BCID) 3/28 UCx: 80k Kleb pneumo   Fredonia Highland, PharmD, BCPS Clinical Pharmacist [phone] Please check AMION for all [facility] Pharmacy numbers 12/16/2018

## 2018-12-16 NOTE — Progress Notes (Signed)
Subjective: Sedated on Fentanyl gtt. No significant events except for occasional twitching overnight per RN.   HPI review from Dr. Johny Chess initial consult note:  "Nicole Lee a 63 y.o.femalepast medical history of COPD, diabetes, hypertension, hyperlipidemia, coronary artery disease status post PCI had a witnessed cardiac arrest when she collapsed at home. CPR was started at home and King's airway was placed in the field with a total downtime of at least 18 minutes. There were 3 defibrillations in the documentation of the HPI and she received 3 rounds of epinephrine prior to return spontaneous circulation. Brought into [facility], ER where she had glucose greater than 1000 along with hypotension and hyperkalemia. Not a candidate for TTM protocol due to metabolic disturbance and documented out of window and other etiology most likely underlying cause. Neurology was consulted because of persistent altered mental status nearly 24 hours after the event as well as question of 2 or 3 episodes of possible myoclonus. According to the nurse, patient had an episode of myoclonus yesterday that resolved with a bolus of fentanyl. She had 2 more episodes of brief eye twitching/eyebrow twitching myoclonus this morning requiring a dose of fentanyl bolus. No  benzodiazepines or antiepileptics were given."  Objective: Current vital signs: BP 137/74   Pulse (!) 102   Temp (!) 101.8 F (38.8 C) (Esophageal)   Resp 13   Ht '5\' 1"'  (1.549 m)   Wt 96.4 kg   SpO2 97%   BMI 40.16 kg/m  Vital signs in last 24 hours: Temp:  [99.7 F (37.6 C)-102.9 F (39.4 C)] 101.8 F (38.8 C) (03/31 0800) Pulse Rate:  [84-129] 102 (03/31 0800) Resp:  [0-28] 13 (03/31 0800) BP: (113-194)/(57-107) 137/74 (03/31 0800) SpO2:  [92 %-99 %] 97 % (03/31 0800) Arterial Line BP: (118-188)/(52-119) 142/64 (03/31 0800) FiO2 (%):  [40 %] 40 % (03/31 0800) Weight:  [96.4 kg] 96.4 kg (03/31 0421)  Intake/Output from  previous day: 03/30 0701 - 03/31 0700 In: 2374 [I.V.:274; NG/GT:1440; IV Piggyback:660] Out: 2385 [Urine:2385] Intake/Output this shift: Total I/O In: 77.4 [I.V.:14.8; NG/GT:50; IV Piggyback:12.5] Out: 0  Nutritional status:  Diet Order            Diet NPO time specified  Diet effective now              Neurologic Exam: Sedated with Fentanyl gtt.  Ment: Eyes open with no purposeful responses to any auditory or tactile stimuli. No posturing or other movement of upper extremities to noxious stimuli.  CN: Pupils 4 mm and unreactive. Doll's eye reflex is suppressed today, in the context of roving EOM (improvement since yesterday). Eyes conjugate. No nystagmus or forced gaze deviation. Corneals intact. Face flaccidly symmetric with no grimace to brow ridge pressure. Cough and gag intact.  Motor/Sensory: Flaccid tone x 4. No movement of upper extremities to noxious stimuli. Briskly upgoing toes to plantar stimulation, without flexion at knees and with minimal ankle movement. No asymmetry noted  Reflexes: Low amplitude reflexes.   Lab Results: Results for orders placed or performed during the hospital encounter of 12/07/2018 (from the past 48 hour(s))  Glucose, capillary     Status: Abnormal   Collection Time: 12/14/18 11:36 AM  Result Value Ref Range   Glucose-Capillary 318 (H) 70 - 99 mg/dL  Lactic acid, plasma     Status: None   Collection Time: 12/14/18 11:51 AM  Result Value Ref Range   Lactic Acid, Venous 1.1 0.5 - 1.9 mmol/L    Comment:  Performed at [facility], [place] [address], [place], [place] [address]  Troponin I - Now Then Q6H     Status: Abnormal   Collection Time: 12/14/18  1:02 PM  Result Value Ref Range   Troponin I 2.94 (HH) <0.03 ng/mL    Comment: CRITICAL VALUE NOTED.  VALUE IS CONSISTENT WITH PREVIOUSLY REPORTED AND CALLED VALUE. Performed at [facility], [place] [address] [place], [place] [address]   Glucose, capillary     Status: Abnormal    Collection Time: 12/14/18  3:29 PM  Result Value Ref Range   Glucose-Capillary 310 (H) 70 - 99 mg/dL  Magnesium     Status: Abnormal   Collection Time: 12/14/18  4:38 PM  Result Value Ref Range   Magnesium 2.5 (H) 1.7 - 2.4 mg/dL    Comment: Performed at [place] [address] [place], [place] [address]  Phosphorus     Status: None   Collection Time: 12/14/18  4:38 PM  Result Value Ref Range   Phosphorus 2.5 2.5 - 4.6 mg/dL    Comment: Performed at [facility], [place] [address], [place], [place] [address]  Glucose, capillary     Status: Abnormal   Collection Time: 12/14/18  7:59 PM  Result Value Ref Range   Glucose-Capillary 333 (H) 70 - 99 mg/dL  Troponin I - Now Then Q6H     Status: Abnormal   Collection Time: 12/14/18  8:07 PM  Result Value Ref Range   Troponin I 2.29 (HH) <0.03 ng/mL    Comment: CRITICAL VALUE NOTED.  VALUE IS CONSISTENT WITH PREVIOUSLY REPORTED AND CALLED VALUE. Performed at [facility], [place] [address] [place], [place] [address]   Glucose, capillary     Status: Abnormal   Collection Time: 12/14/18 11:05 PM  Result Value Ref Range   Glucose-Capillary 325 (H) 70 - 99 mg/dL  Glucose, capillary     Status: Abnormal   Collection Time: 12/15/18  4:16 AM  Result Value Ref Range   Glucose-Capillary 329 (H) 70 - 99 mg/dL  Magnesium     Status: None   Collection Time: 12/15/18  5:06 AM  Result Value Ref Range   Magnesium 2.2 1.7 - 2.4 mg/dL    Comment: Performed at [facility], [place] [address] [place], [place] [address]  Phosphorus     Status: Abnormal   Collection Time: 12/15/18  5:06 AM  Result Value Ref Range   Phosphorus 1.7 (L) 2.5 - 4.6 mg/dL    Comment: Performed at [facility] [address], [place], [place] [address]  CBC     Status: Abnormal   Collection Time: 12/15/18  5:06 AM  Result Value Ref Range   WBC 8.3 4.0 - 10.5 K/uL   RBC 3.91 3.87 - 5.11 MIL/uL   Hemoglobin 10.1 (L) 12.0 - 15.0 g/dL   HCT 33.2  (L) 36.0 - 46.0 %   MCV 84.9 80.0 - 100.0 fL   MCH 25.8 (L) 26.0 - 34.0 pg   MCHC 30.4 30.0 - 36.0 g/dL   RDW 15.2 11.5 - 15.5 %   Platelets 155 150 - 400 K/uL   nRBC 0.0 0.0 - 0.2 %    Comment: Performed at [facility], [place] [address] [place], [place] [address]  Basic metabolic panel     Status: Abnormal   Collection Time: 12/15/18  5:06 AM  Result Value Ref Range  Sodium 135 135 - 145 mmol/L   Potassium 4.6 3.5 - 5.1 mmol/L   Chloride 104 98 - 111 mmol/L   CO2 23 22 - 32 mmol/L   Glucose, Bld 313 (H) 70 - 99 mg/dL   BUN 19 8 - 23 mg/dL   Creatinine, Ser 0.77 0.44 - 1.00 mg/dL   Calcium 8.0 (L) 8.9 - 10.3 mg/dL   GFR calc non Af Amer >60 >60 mL/min   GFR calc Af Amer >60 >60 mL/min   Anion gap 8 5 - 15    Comment: Performed at [facility] [address], [place], [place] [address]  Glucose, capillary     Status: Abnormal   Collection Time: 12/15/18  7:37 AM  Result Value Ref Range   Glucose-Capillary 283 (H) 70 - 99 mg/dL  Glucose, capillary     Status: Abnormal   Collection Time: 12/15/18 11:37 AM  Result Value Ref Range   Glucose-Capillary 295 (H) 70 - 99 mg/dL  Glucose, capillary     Status: Abnormal   Collection Time: 12/15/18  3:42 PM  Result Value Ref Range   Glucose-Capillary 334 (H) 70 - 99 mg/dL  Glucose, capillary     Status: Abnormal   Collection Time: 12/15/18  8:10 PM  Result Value Ref Range   Glucose-Capillary 272 (H) 70 - 99 mg/dL  Glucose, capillary     Status: Abnormal   Collection Time: 12/15/18 10:34 PM  Result Value Ref Range   Glucose-Capillary 265 (H) 70 - 99 mg/dL  Glucose, capillary     Status: Abnormal   Collection Time: 12/15/18 11:35 PM  Result Value Ref Range   Glucose-Capillary 327 (H) 70 - 99 mg/dL   Comment 1 Notify RN   Basic metabolic panel     Status: Abnormal   Collection Time: 12/16/18  3:34 AM  Result Value Ref Range   Sodium 138 135 - 145 mmol/L   Potassium 3.5 3.5 - 5.1 mmol/L   Chloride 104 98 - 111 mmol/L    CO2 25 22 - 32 mmol/L   Glucose, Bld 282 (H) 70 - 99 mg/dL   BUN 24 (H) 8 - 23 mg/dL   Creatinine, Ser 0.69 0.44 - 1.00 mg/dL   Calcium 8.1 (L) 8.9 - 10.3 mg/dL   GFR calc non Af Amer >60 >60 mL/min   GFR calc Af Amer >60 >60 mL/min   Anion gap 9 5 - 15    Comment: Performed at [facility], [place] [address], [place], [place] [address]  CBC     Status: Abnormal   Collection Time: 12/16/18  3:34 AM  Result Value Ref Range   WBC 7.9 4.0 - 10.5 K/uL   RBC 3.94 3.87 - 5.11 MIL/uL   Hemoglobin 10.2 (L) 12.0 - 15.0 g/dL   HCT 33.7 (L) 36.0 - 46.0 %   MCV 85.5 80.0 - 100.0 fL   MCH 25.9 (L) 26.0 - 34.0 pg   MCHC 30.3 30.0 - 36.0 g/dL   RDW 15.2 11.5 - 15.5 %   Platelets 150 150 - 400 K/uL   nRBC 0.0 0.0 - 0.2 %    Comment: Performed at [facility], [place] [address], [place], [place] [address]  Glucose, capillary     Status: Abnormal   Collection Time: 12/16/18  4:18 AM  Result Value Ref Range   Glucose-Capillary 255 (H) 70 - 99 mg/dL  Glucose, capillary     Status: Abnormal  Collection Time: 12/16/18  7:45 AM  Result Value Ref Range   Glucose-Capillary 259 (H) 70 - 99 mg/dL   Comment 1 Notify RN     Recent Results (from the past 240 hour(s))  Blood culture (routine x 2)     Status: Abnormal   Collection Time: 11/23/2018  8:43 AM  Result Value Ref Range Status   Specimen Description BLOOD LEFT ANTECUBITAL  Final   Special Requests   Final    BOTTLES DRAWN AEROBIC ONLY Blood Culture adequate volume   Culture  Setup Time   Final    GRAM POSITIVE COCCI AEROBIC BOTTLE ONLY CRITICAL RESULT CALLED TO, READ BACK BY AND VERIFIED WITH: PHARMD G ABBOTT [id number] AT 59 AM BY CM    Culture (A)  Final    STAPHYLOCOCCUS SPECIES (COAGULASE NEGATIVE) THE SIGNIFICANCE OF ISOLATING THIS ORGANISM FROM A SINGLE SET OF BLOOD CULTURES WHEN MULTIPLE SETS ARE DRAWN IS UNCERTAIN. PLEASE NOTIFY THE MICROBIOLOGY DEPARTMENT WITHIN ONE WEEK IF SPECIATION AND SENSITIVITIES ARE REQUIRED. Performed  at [facility], [place] [address] [place], [place] [address]    Report Status 12/16/2018 FINAL  Final  Blood Culture ID Panel (Reflexed)     Status: Abnormal   Collection Time: 11/17/2018  8:43 AM  Result Value Ref Range Status   Enterococcus species NOT DETECTED NOT DETECTED Final   Listeria monocytogenes NOT DETECTED NOT DETECTED Final   Staphylococcus species DETECTED (A) NOT DETECTED Final    Comment: Methicillin (oxacillin) resistant coagulase negative staphylococcus. Possible blood culture contaminant (unless isolated from more than one blood culture draw or clinical case suggests pathogenicity). No antibiotic treatment is indicated for blood  culture contaminants. CRITICAL RESULT CALLED TO, READ BACK BY AND VERIFIED WITH: PHARMD [id number] AT 309 AM BY CM    Staphylococcus aureus (BCID) NOT DETECTED NOT DETECTED Final   Methicillin resistance DETECTED (A) NOT DETECTED Final    Comment: CRITICAL RESULT CALLED TO, READ BACK BY AND VERIFIED WITH: PHARMD [id number] AT 881 AM BY CM    Streptococcus species NOT DETECTED NOT DETECTED Final   Streptococcus agalactiae NOT DETECTED NOT DETECTED Final   Streptococcus pneumoniae NOT DETECTED NOT DETECTED Final   Streptococcus pyogenes NOT DETECTED NOT DETECTED Final   Acinetobacter baumannii NOT DETECTED NOT DETECTED Final   Enterobacteriaceae species NOT DETECTED NOT DETECTED Final   Enterobacter cloacae complex NOT DETECTED NOT DETECTED Final   Escherichia coli NOT DETECTED NOT DETECTED Final   Klebsiella oxytoca NOT DETECTED NOT DETECTED Final   Klebsiella pneumoniae NOT DETECTED NOT DETECTED Final   Proteus species NOT DETECTED NOT DETECTED Final   Serratia marcescens NOT DETECTED NOT DETECTED Final   Haemophilus influenzae NOT DETECTED NOT DETECTED Final   Neisseria meningitidis NOT DETECTED NOT DETECTED Final   Pseudomonas aeruginosa NOT DETECTED NOT DETECTED Final   Candida albicans NOT DETECTED NOT DETECTED Final   Candida glabrata  NOT DETECTED NOT DETECTED Final   Candida krusei NOT DETECTED NOT DETECTED Final   Candida parapsilosis NOT DETECTED NOT DETECTED Final   Candida tropicalis NOT DETECTED NOT DETECTED Final    Comment: Performed at [facility], [address], [place], [place] [address]  Urine culture     Status: Abnormal   Collection Time: 11/24/2018  9:28 AM  Result Value Ref Range Status   Specimen Description URINE, CLEAN CATCH  Final   Special Requests   Final    NONE Performed at [facility], [place] [address]  [place], [place] [address]    Culture 80,000 COLONIES/mL KLEBSIELLA PNEUMONIAE (A)  Final   Report Status 12/15/2018 FINAL  Final   Organism ID, Bacteria KLEBSIELLA PNEUMONIAE (A)  Final      Susceptibility   Klebsiella pneumoniae - MIC*    AMPICILLIN >=32 RESISTANT Resistant     CEFAZOLIN 16 SENSITIVE Sensitive     CEFTRIAXONE <=1 SENSITIVE Sensitive     CIPROFLOXACIN <=0.25 SENSITIVE Sensitive     GENTAMICIN <=1 SENSITIVE Sensitive     IMIPENEM <=0.25 SENSITIVE Sensitive     NITROFURANTOIN 64 INTERMEDIATE Intermediate     TRIMETH/SULFA >=320 RESISTANT Resistant     AMPICILLIN/SULBACTAM >=32 RESISTANT Resistant     PIP/TAZO <=4 SENSITIVE Sensitive     Extended ESBL NEGATIVE Sensitive     * 80,000 COLONIES/mL KLEBSIELLA PNEUMONIAE  Blood culture (routine x 2)     Status: None (Preliminary result)   Collection Time: 12/14/2018 10:59 AM  Result Value Ref Range Status   Specimen Description BLOOD RIGHT ARM  Final   Special Requests   Final    BOTTLES DRAWN AEROBIC AND ANAEROBIC Blood Culture adequate volume   Culture   Final    NO GROWTH 3 DAYS Performed at [facility], [address] [place], [place] [address]    Report Status PENDING  Incomplete  MRSA PCR Screening     Status: None   Collection Time: 11/18/2018  4:25 PM  Result Value Ref Range Status   MRSA by PCR NEGATIVE NEGATIVE Final    Comment:        The GeneXpert MRSA Assay (FDA approved for NASAL  specimens only), is one component of a comprehensive MRSA colonization surveillance program. It is not intended to diagnose MRSA infection nor to guide or monitor treatment for MRSA infections. Performed at [facility], [place] [address] [place], [place] [address]     Lipid Panel No results for input(s): CHOL, TRIG, HDL, CHOLHDL, VLDL, LDLCALC in the last 72 hours.  Studies/Results: Dg Chest Port 1 View  Result Date: 12/15/2018 CLINICAL DATA:  Cardiac arrest.  COPD EXAM: PORTABLE CHEST 1 VIEW COMPARISON:  Radiograph 12/14/2018 FINDINGS: Endotracheal to NG tube unchanged. Stable enlarged cardiac silhouette. Bilateral pleural effusions greater on the LEFT. Slight increase in effusion on the RIGHT. Upper lungs are clear. No pneumothorax. IMPRESSION: Stable support apparatus. Bilateral pleural effusions with mild increase in the RIGHT pleural effusion Electronically Signed   By: Suzy Bouchard M.D.   On: 12/15/2018 08:14    Medications:  Scheduled: . aspirin  81 mg Per Tube Daily  . atorvastatin  40 mg Per Tube Daily  . chlorhexidine gluconate (MEDLINE KIT)  15 mL Mouth Rinse BID  . Chlorhexidine Gluconate Cloth  6 each Topical Daily  . clopidogrel  75 mg Per Tube Daily  . docusate  100 mg Per Tube Daily  . famotidine  20 mg Per Tube BID  . furosemide  20 mg Intravenous Daily  . heparin  5,000 Units Subcutaneous Q8H  . insulin aspart  0-20 Units Subcutaneous Q4H  . insulin detemir  20 Units Subcutaneous BID  . mouth rinse  15 mL Mouth Rinse 10 times per day  . metoprolol tartrate  12.5 mg Per Tube Q8H   Continuous: . feeding supplement (VITAL HIGH PROTEIN) 50 mL/hr at 12/16/18 0800  . fentaNYL infusion INTRAVENOUS 100 mcg/hr (12/16/18 0700)  . piperacillin-tazobactam (ZOSYN)  IV 12.5 mL/hr at 12/16/18 0700    Assessment:63 year old female with PMHx  of COPD, diabetes, HTN, HLD and CAD status post PCI who presented after witnessed cardiac arrest with downtime of at least 18  minutes. Was also noted to be hyperkalemic and hyperglycemic with DKA versus HHS. Continues to be unresponsive today, after greater than 48 hours post-arrest. Neurology was consulted for concern for myoclonus - she had 3 episodes that resolved with fentanyl boluses. Initial Neurology exam showed intact brainstem reflexes, but no purposeful higher cortical function. 1. Today's exam is most consistent with minimal higher cortical function. The only improvement today is suppression of her oculocephalic reflex, which is suggestive of some cortical function. 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  2. 72 hours has elapsed since the cardiac arrest.  3. EEG was abnormal secondary to a slow discontinuous background.    Recommendations: - MRI brain w/o contrast has been ordered. - Neurology will continue to follow   LOS: 3 days    35 minutes spent in the neurological evaluation and management of this critically ill patient.  '@Electronically'  signed: Dr. Kerney Elbe 12/16/2018  8:46 AM

## 2018-12-16 NOTE — Significant Event (Signed)
Patient taken to MRI with RN Byrd Hesselbach, RT, and transport.     Nicole Lee

## 2018-12-16 NOTE — Progress Notes (Signed)
eLink Physician-Brief Progress Note Patient Name: Nicole Lee DOB: Aug 17, 1956 MRN: [id number]   Date of Service  12/16/2018  HPI/Events of Note  Oliguria - Bladder scan with 190 mL residual. LVEF = 20-25%.  eICU Interventions  Will bolus with 0.9 NaCl 500 mL IV over 30 minutes now.      Intervention Category Intermediate Interventions: Oliguria - evaluation and management  Larosa Rhines Eugene 12/16/2018, 3:20 AM

## 2018-12-16 NOTE — Progress Notes (Signed)
Inpatient Diabetes Program Recommendations  AACE/ADA: New Consensus Statement on Inpatient Glycemic Control (2015)  Target Ranges:  Prepandial:   less than 140 mg/dL      Peak postprandial:   less than 180 mg/dL (1-2 hours)      Critically ill patients:  140 - 180 mg/dL   Lab Results  Component Value Date   GLUCAP 259 (H) 12/16/2018   HGBA1C 12.5 (H) 11/28/2018    Review of Glycemic Control Results for ALAUNA, Nicole Lee (MRN [id number]) as of 12/16/2018 10:44  Ref. Range 12/15/2018 23:35 12/16/2018 04:18 12/16/2018 07:45  Glucose-Capillary Latest Ref Range: 70 - 99 mg/dL 282 (H) 060 (H) 156 (H)   Diabetes history: DM 2 Outpatient Diabetes medications:  Levemir 60 units bid, Metformin 500 mg bid Current orders for Inpatient glycemic control:  Levemir 30 units bid, Novolog resistant q 4 hours Vital 50 cc/hr Inpatient Diabetes Program Recommendations:    Agree with increase in Levemir.  May also consider adding Novolog tube feed coverage 3 units q 4 hours.    Thanks, Lujean Rave, MSN, RNC-OB Diabetes Coordinator [phone] (8a-5p)

## 2018-12-16 NOTE — Progress Notes (Signed)
NAME:  Nicole Lee, MRN:  [id number], DOB:  07-24-1956, LOS: 3 ADMISSION DATE:  11/22/2018, CONSULTATION DATE:  11/21/2018 REFERRING MD:  EDP Dr. Dalene Seltzer  , CHIEF COMPLAINT:  Cardiac Arrest    Brief History   63 yo female former smoker with PEA/VF arrest with ROSC in 10 min.  Blood sugar was > 1000, hyperkalemia.  Past Medical History  Diabetes, hyperlipidemia, COPD, coronary artery disease  Significant Hospital Events   3/28 Admit, cardiology consulted 3/29 Neuro consulted  Consults:  3/28 cardiology 3/29 neuro   Procedures:  3/28 ETT  Significant Diagnostic Tests:  3/28 echo>>EF 20-25%, degenerative MV , LV global hypokinesis  3/29 EEG >> background slowing  Micro Data:  Blood 3/28 >> Coag neg Staph Urine 3/28 >> Klebsiella  Antimicrobials:  3/28 vancomycin >> 3/30 3/28 Zosyn >>   Interim history/subjective:  Remains on vent  Objective   Blood pressure 137/74, pulse (!) 102, temperature (!) 101.8 F (38.8 C), temperature source Esophageal, resp. rate 13, height 5\' 1"  (1.549 m), weight 96.4 kg, SpO2 97 %.    Vent Mode: PSV;CPAP FiO2 (%):  [40 %] 40 % Set Rate:  [22 bmp] 22 bmp Vt Set:  [420 mL] 420 mL PEEP:  [5 cmH20] 5 cmH20 Pressure Support:  [15 cmH20] 15 cmH20 Plateau Pressure:  [12 cmH20-17 cmH20] 17 cmH20   Intake/Output Summary (Last 24 hours) at 12/16/2018 0959 Last data filed at 12/16/2018 0900 Gross per 24 hour  Intake 2387.57 ml  Output 2285 ml  Net 102.57 ml   Filed Weights   12/14/18 0600 12/15/18 0500 12/16/18 0421  Weight: 93.6 kg 96.7 kg 96.4 kg    Examination:  General - on vent Eyes - pupils reactive ENT - ETT in place Cardiac - regular rate/rhythm, no murmur Chest - scattered rhonchi Abdomen - soft, non tender, + bowel sounds Extremities - 1+ edema Skin - no rashes Neuro - not following commands   Resolved Hospital Problem list   Metabolic acidosis, lactic acidosis, DKA, AKI with ATN, Elevated LFTs from shock   Assessment & Plan:   Acute hypoxic respiratory failure from cardiac arrest and possible aspiration pneumonia. Hx of COPD. Plan - pressure support - mental status precludes extubation trial - might need trach if family wishes to continue aggressive therapy - f/u CXR intermittently  Fever, Klebsiella UTI, Possible aspiration PNA. Coag neg Staph in blood culture >> contaminate. Plan - day 4/7 of zosyn  PEA/VF cardiac arrest. NSTEMI. Acute systolic CHF. Hx of CAD, HLD. Plan - continue ASA, lopressor, lipitor, plavix - change lasix to 40 mg daily - hold outpt aldactone  DM. 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 Plan - RASS goal 0 - neurology consulted - f/u MRI brain  Best practice:  Diet: tube feeds DVT prophylaxis: SQ heparin GI prophylaxis: Pepcid Mobility: Bedrest Code Status: Full Disposition: ICU  Labs    CMP Latest Ref Rng & Units 12/16/2018 12/15/2018 12/14/2018  Glucose 70 - 99 mg/dL 564(P) 329(J) -  BUN 8 - 23 mg/dL 18(A) 19 -  Creatinine 0.44 - 1.00 mg/dL 4.16 6.06 -  Sodium 301 - 145 mmol/L 138 135 138  Potassium 3.5 - 5.1 mmol/L 3.5 4.6 4.0  Chloride 98 - 111 mmol/L 104 104 -  CO2 22 - 32 mmol/L 25 23 -  Calcium 8.9 - 10.3 mg/dL 8.1(L) 8.0(L) -  Total Protein  6.5 - 8.1 g/dL - - -  Total Bilirubin 0.3 - 1.2 mg/dL - - -  Alkaline Phos 38 - 126 U/L - - -  AST 15 - 41 U/L - - -  ALT 0 - 44 U/L - - -   CBC Latest Ref Rng & Units 12/16/2018 12/15/2018 12/14/2018  WBC 4.0 - 10.5 K/uL 7.9 8.3 -  Hemoglobin 12.0 - 15.0 g/dL 10.2(L) 10.1(L) 10.9(L)  Hematocrit 36.0 - 46.0 % 33.7(L) 33.2(L) 32.0(L)  Platelets 150 - 400 K/uL 150 155 -   ABG    Component Value Date/Time   PHART 7.320 (L) 12/14/2018 0454   PCO2ART 44.1 12/14/2018 0454   PO2ART 79.0 (L) 12/14/2018 0454   HCO3 22.6 12/14/2018 0454   TCO2 24 12/14/2018 0454   ACIDBASEDEF 3.0 (H)  12/14/2018 0454   O2SAT 94.0 12/14/2018 0454   CBG (last 3)  Recent Labs    12/15/18 2335 12/16/18 0418 12/16/18 0745  GLUCAP 327* 255* 259*    Coralyn Helling, MD [facility] Pulmonary/Critical Care 12/16/2018, 9:59 AM

## 2018-12-16 NOTE — Progress Notes (Signed)
Patient was transported to MRI & back to 2H05 without any complications.

## 2018-12-17 DIAGNOSIS — G931 Anoxic brain damage, not elsewhere classified: Secondary | ICD-10-CM

## 2018-12-17 LAB — BASIC METABOLIC PANEL
Anion gap: 10 (ref 5–15)
BUN: 33 mg/dL — ABNORMAL HIGH (ref 8–23)
CO2: 27 mmol/L (ref 22–32)
Calcium: 8.7 mg/dL — ABNORMAL LOW (ref 8.9–10.3)
Chloride: 105 mmol/L (ref 98–111)
Creatinine, Ser: 0.84 mg/dL (ref 0.44–1.00)
GFR calc Af Amer: >60 mL/min (ref >60)
GFR calc non Af Amer: >60 mL/min (ref >60)
Glucose, Bld: 254 mg/dL — ABNORMAL HIGH (ref 70–99)
Potassium: 3.3 mmol/L — ABNORMAL LOW (ref 3.5–5.1)
Sodium: 142 mmol/L (ref 135–145)

## 2018-12-17 LAB — GLUCOSE, CAPILLARY
Glucose-Capillary: 231 mg/dL — ABNORMAL HIGH (ref 70–99)
Glucose-Capillary: 255 mg/dL — ABNORMAL HIGH (ref 70–99)
Glucose-Capillary: 293 mg/dL — ABNORMAL HIGH (ref 70–99)
Glucose-Capillary: 312 mg/dL — ABNORMAL HIGH (ref 70–99)

## 2018-12-17 LAB — CBC
HCT: 34.6 % — ABNORMAL LOW (ref 36.0–46.0)
Hemoglobin: 10.5 g/dL — ABNORMAL LOW (ref 12.0–15.0)
MCH: 26 pg (ref 26.0–34.0)
MCHC: 30.3 g/dL (ref 30.0–36.0)
MCV: 85.6 fL (ref 80.0–100.0)
Platelets: 168 10*3/uL (ref 150–400)
RBC: 4.04 MIL/uL (ref 3.87–5.11)
RDW: 15.5 % (ref 11.5–15.5)
WBC: 8.1 10*3/uL (ref 4.0–10.5)
nRBC: 0.4 % — ABNORMAL HIGH (ref 0.0–0.2)

## 2018-12-17 MED ORDER — SODIUM CHLORIDE 0.9 % IV SOLN
2000.0000 mg | Freq: Once | INTRAVENOUS | Status: AC
  Administered 2018-12-17: 12:00:00 2000 mg via INTRAVENOUS
  Filled 2018-12-17: qty 20

## 2018-12-17 MED ORDER — LEVETIRACETAM IN NACL 1000 MG/100ML IV SOLN
1000.0000 mg | Freq: Two times a day (BID) | INTRAVENOUS | Status: DC
  Filled 2018-12-17: qty 100

## 2018-12-17 MED ORDER — LEVETIRACETAM IN NACL 1000 MG/100ML IV SOLN
1000.0000 mg | Freq: Two times a day (BID) | INTRAVENOUS | Status: DC
  Administered 2018-12-18 – 2018-12-20 (×5): 1000 mg via INTRAVENOUS
  Filled 2018-12-17 (×4): qty 100

## 2018-12-17 MED ORDER — POTASSIUM CHLORIDE 20 MEQ/15ML (10%) PO SOLN
20.0000 meq | ORAL | Status: DC
  Administered 2018-12-17: 06:00:00 20 meq
  Filled 2018-12-17: qty 15

## 2018-12-17 NOTE — Progress Notes (Addendum)
NAME:  Nicole Lee, MRN:  [id number], DOB:  10-04-1955, LOS: 4 ADMISSION DATE:  12/03/2018, CONSULTATION DATE:  12/14/2018 REFERRING MD:  EDP Dr. Dalene Seltzer  , CHIEF COMPLAINT:  Cardiac Arrest    Brief History   63 yo female former smoker with PEA/VF arrest with ROSC in 10 min.  Blood sugar was > 1000, hyperkalemia.  Past Medical History  Diabetes, hyperlipidemia, COPD, coronary artery disease  Significant Hospital Events   3/28 Admit, cardiology consulted 3/29 Neuro consulted 4/01 Focal seizure activity; DNR, no escalation of care  Consults:  3/28 cardiology 3/29 neuro   Procedures:  3/28 ETT >>   Significant Diagnostic Tests:  3/28 echo>>EF 20-25%, degenerative MV , LV global hypokinesis  3/29 EEG >> background slowing 3/31 MRI brain >> multifocal acute ischemia from hypoxic injury  Micro Data:  Blood 3/28 >> Coag neg Staph Urine 3/28 >> Klebsiella  Antimicrobials:  3/28 vancomycin >> 3/30 3/28 Zosyn >>   Interim history/subjective:  Focal seizures.  Objective   Blood pressure 131/73, pulse (!) 109, temperature 100.1 F (37.8 C), temperature source Oral, resp. rate 18, height 5\' 1"  (1.549 m), weight 95.4 kg, SpO2 97 %.    Vent Mode: PSV;CPAP FiO2 (%):  [40 %] 40 % Set Rate:  [22 bmp] 22 bmp Vt Set:  [420 mL] 420 mL PEEP:  [5 cmH20] 5 cmH20 Pressure Support:  [10 cmH20] 10 cmH20 Plateau Pressure:  [12 cmH20-20 cmH20] 12 cmH20   Intake/Output Summary (Last 24 hours) at 12/17/2018 0910 Last data filed at 12/17/2018 0700 Gross per 24 hour  Intake 1599.22 ml  Output 2175 ml  Net -575.78 ml   Filed Weights   12/15/18 0500 12/16/18 0421 12/17/18 0413  Weight: 96.7 kg 96.4 kg 95.4 kg    Examination:  General - unresponsive Eyes - roving eye movements ENT - ETT in place Cardiac - regular rate/rhythm, no murmur Chest - scattered rhochi Abdomen - soft, non tender, + bowel sounds Extremities - no cyanosis, clubbing, or edema Skin - no rashes Neuro -  intermittent focal seizures involving arms      Resolved Hospital Problem list   Metabolic acidosis, lactic acidosis, DKA, AKI with ATN, Elevated LFTs from shock  Assessment:   Acute hypoxic respiratory failure from cardiac arrest. Aspiration pneumonia. Hx of COPD. Fever. Klebsiella UTI. PEA/VF cardiac arrest. NSTEMI. Acute systolic CHF. Hx of CAD, HLD. DM. Acute anoxic encephalopathy. Focal seizures. Vegetative state. Hypokalemia.  Plan:  - DNR - No escalation of care - Family gathering, and then will proceed with comfort measures; likely in next 24 hours  Best practice:  Diet: tube feeds DVT prophylaxis: SQ heparin GI prophylaxis: Pepcid Mobility: Bedrest Code Status: spoke with pt's son over the phone.  Explained MRI and clinic findings in conjunction with Dr. Otelia Limes.  Family would not want aggressive care with no realistic hope of meaningful recovery.  DNR, no escalation of care.  Labs    CMP Latest Ref Rng & Units 12/17/2018 12/16/2018 12/15/2018  Glucose 70 - 99 mg/dL 500(X) 381(W) 299(B)  BUN 8 - 23 mg/dL 71(I) 96(V) 19  Creatinine 0.44 - 1.00 mg/dL 8.93 8.10 1.75  Sodium 135 - 145 mmol/L 142 138 135  Potassium 3.5 - 5.1 mmol/L 3.3(L) 3.5 4.6  Chloride 98 - 111 mmol/L 105 104 104  CO2 22 - 32 mmol/L 27 25 23   Calcium 8.9 - 10.3 mg/dL 1.0(C) 8.1(L) 8.0(L)  Total Protein 6.5 - 8.1 g/dL - - -  Total Bilirubin  0.3 - 1.2 mg/dL - - -  Alkaline Phos 38 - 126 U/L - - -  AST 15 - 41 U/L - - -  ALT 0 - 44 U/L - - -   CBC Latest Ref Rng & Units 12/17/2018 12/16/2018 12/15/2018  WBC 4.0 - 10.5 K/uL 8.1 7.9 8.3  Hemoglobin 12.0 - 15.0 g/dL 10.5(L) 10.2(L) 10.1(L)  Hematocrit 36.0 - 46.0 % 34.6(L) 33.7(L) 33.2(L)  Platelets 150 - 400 K/uL 168 150 155   ABG    Component Value Date/Time   PHART 7.320 (L) 12/14/2018 0454   PCO2ART 44.1 12/14/2018 0454   PO2ART 79.0 (L) 12/14/2018 0454   HCO3 22.6 12/14/2018 0454   TCO2 24 12/14/2018 0454   ACIDBASEDEF 3.0 (H)  12/14/2018 0454   O2SAT 94.0 12/14/2018 0454   CBG (last 3)  Recent Labs    12/17/18 0027 12/17/18 0406 12/17/18 0739  GLUCAP 312* 231* 255*   D/w Dr. Sande Brothers, MD [facility] Pulmonary/Critical Care 12/17/2018, 9:10 AM

## 2018-12-17 NOTE — Progress Notes (Signed)
[facility] ADULT ICU REPLACEMENT PROTOCOL FOR AM LAB REPLACEMENT ONLY  The patient does apply for the [facility] Adult ICU Electrolyte Replacment Protocol based on the criteria listed below:   1. Is GFR >/= 40 ml/min? Yes.    Patient's GFR today is >60 2. Is urine output >/= 0.5 ml/kg/hr for the last 6 hours? Yes.   Patient's UOP is 2 ml/kg/hr 3. Is BUN < 60 mg/dL? Yes.    Patient's BUN today is 33 4. Abnormal electrolyte(s): K-3.3 5. Ordered repletion with: per protocol 6. If a panic level lab has been reported, has the CCM MD in charge been notified? Yes.  .   Physician:  Dr. Janne Lab, Dixon Boos 12/17/2018 5:51 AM

## 2018-12-17 NOTE — Progress Notes (Signed)
Subjective: MRI brain was completed yesterday.   HPI review from Dr. Johny Chess initial consult note:  "Nicole Lee a 63 y.o.femalepast medical history of COPD, diabetes, hypertension, hyperlipidemia, coronary artery disease status post PCI had a witnessed cardiac arrest when she collapsed at home. CPR was started at home and King's airway was placed in the field with a total downtime of at least 18 minutes. There were 3 defibrillations in the documentation of the HPI and she received 3 rounds of epinephrine prior to return spontaneous circulation. Brought into [facility], ER where she had glucose greater than 1000 along with hypotension and hyperkalemia. Not a candidate for TTM protocol due to metabolic disturbance and documented out of window and other etiology most likely underlying cause. Neurology was consulted because of persistent altered mental status nearly 24 hours after the event as well as question of 2 or 3 episodes of possible myoclonus. According to the nurse, patient had an episode of myoclonus yesterday that resolved with a bolus of fentanyl. She had 2 more episodes of brief eye twitching/eyebrow twitching myoclonus this morning requiring a dose of fentanyl bolus. No benzodiazepines or antiepileptics were given."  Objective: Current vital signs: BP (!) 149/100 (BP Location: Right Arm)   Pulse 100   Temp (!) 101.2 F (38.4 C) (Oral)   Resp (!) 23   Ht '5\' 1"'  (1.549 m)   Wt 95.4 kg   SpO2 96%   BMI 39.74 kg/m  Vital signs in last 24 hours: Temp:  [98.8 F (37.1 C)-102.6 F (39.2 C)] 101.2 F (38.4 C) (04/01 0400) Pulse Rate:  [65-124] 100 (04/01 0400) Resp:  [11-25] 23 (04/01 0400) BP: (123-173)/(51-100) 149/100 (04/01 0400) SpO2:  [92 %-100 %] 96 % (04/01 0400) Arterial Line BP: (115-177)/(48-142) 147/59 (04/01 0400) FiO2 (%):  [40 %] 40 % (04/01 0400) Weight:  [95.4 kg] 95.4 kg (04/01 0413)  Intake/Output from previous day: 03/31 0701 - 04/01  0700 In: 1563.9 [I.V.:445.6; NG/GT:975; IV Piggyback:143.3] Out: 2175 [Urine:2175] Intake/Output this shift: No intake/output data recorded. Nutritional status:  Diet Order            Diet NPO time specified  Diet effective now             HEENT: [place]/AT Lungs: Intubated Ext: Warm to touch  Neurologic Exam: Performed in the context of intermittent seizures with L > R rhythmic jerking and leftward eye deviation.  Ment: Overall appearance/responses most consistent with a vegetative/minimally conscious state. No response to questions or commands. Eyes spontaneously open. Occassional semipurposeful movements of upper extremities to sternal rub.  CN: Pupils sluggishly reactive. Left pupil slightly smaller than right, 2-3 mm. Eyes deviate to the left with seizures that are intermittently seen during the exam.  Motor/Sensory: Intermittent seizures with L > R rhythmic jerking and leftward eye deviation. Moves upper extremities semipurposefully in response to noxious stimuli between seizures. Triple flexion responses of BLE to plantar stimulation.    Lab Results: Results for orders placed or performed during the hospital encounter of 12/07/2018 (from the past 48 hour(s))  Glucose, capillary     Status: Abnormal   Collection Time: 12/15/18  7:37 AM  Result Value Ref Range   Glucose-Capillary 283 (H) 70 - 99 mg/dL  Glucose, capillary     Status: Abnormal   Collection Time: 12/15/18 11:37 AM  Result Value Ref Range   Glucose-Capillary 295 (H) 70 - 99 mg/dL  Glucose, capillary     Status: Abnormal   Collection Time: 12/15/18  3:42 PM  Result Value Ref Range   Glucose-Capillary 334 (H) 70 - 99 mg/dL  Glucose, capillary     Status: Abnormal   Collection Time: 12/15/18  8:10 PM  Result Value Ref Range   Glucose-Capillary 272 (H) 70 - 99 mg/dL  Glucose, capillary     Status: Abnormal   Collection Time: 12/15/18 10:34 PM  Result Value Ref Range   Glucose-Capillary 265 (H) 70 - 99 mg/dL   Glucose, capillary     Status: Abnormal   Collection Time: 12/15/18 11:35 PM  Result Value Ref Range   Glucose-Capillary 327 (H) 70 - 99 mg/dL   Comment 1 Notify RN   Basic metabolic panel     Status: Abnormal   Collection Time: 12/16/18  3:34 AM  Result Value Ref Range   Sodium 138 135 - 145 mmol/L   Potassium 3.5 3.5 - 5.1 mmol/L   Chloride 104 98 - 111 mmol/L   CO2 25 22 - 32 mmol/L   Glucose, Bld 282 (H) 70 - 99 mg/dL   BUN 24 (H) 8 - 23 mg/dL   Creatinine, Ser 0.69 0.44 - 1.00 mg/dL   Calcium 8.1 (L) 8.9 - 10.3 mg/dL   GFR calc non Af Amer >60 >60 mL/min   GFR calc Af Amer >60 >60 mL/min   Anion gap 9 5 - 15    Comment: Performed at [facility] [address] [place], [place] [address]  CBC     Status: Abnormal   Collection Time: 12/16/18  3:34 AM  Result Value Ref Range   WBC 7.9 4.0 - 10.5 K/uL   RBC 3.94 3.87 - 5.11 MIL/uL   Hemoglobin 10.2 (L) 12.0 - 15.0 g/dL   HCT 33.7 (L) 36.0 - 46.0 %   MCV 85.5 80.0 - 100.0 fL   MCH 25.9 (L) 26.0 - 34.0 pg   MCHC 30.3 30.0 - 36.0 g/dL   RDW 15.2 11.5 - 15.5 %   Platelets 150 150 - 400 K/uL   nRBC 0.0 0.0 - 0.2 %    Comment: Performed at [facility], [place] [address] [place], [place] [address]  Glucose, capillary     Status: Abnormal   Collection Time: 12/16/18  4:18 AM  Result Value Ref Range   Glucose-Capillary 255 (H) 70 - 99 mg/dL  Glucose, capillary     Status: Abnormal   Collection Time: 12/16/18  7:45 AM  Result Value Ref Range   Glucose-Capillary 259 (H) 70 - 99 mg/dL   Comment 1 Notify RN   Glucose, capillary     Status: Abnormal   Collection Time: 12/16/18 12:16 PM  Result Value Ref Range   Glucose-Capillary 321 (H) 70 - 99 mg/dL   Comment 1 Notify RN   Glucose, capillary     Status: Abnormal   Collection Time: 12/16/18  3:16 PM  Result Value Ref Range   Glucose-Capillary 249 (H) 70 - 99 mg/dL   Comment 1 Notify RN   Glucose, capillary     Status: Abnormal   Collection Time: 12/16/18  7:50  PM  Result Value Ref Range   Glucose-Capillary 284 (H) 70 - 99 mg/dL  Glucose, capillary     Status: Abnormal   Collection Time: 12/16/18 10:45 PM  Result Value Ref Range   Glucose-Capillary 293 (H) 70 - 99 mg/dL  Glucose, capillary     Status: Abnormal   Collection Time: 12/17/18 12:27 AM  Result Value Ref Range  Glucose-Capillary 312 (H) 70 - 99 mg/dL  Glucose, capillary     Status: Abnormal   Collection Time: 12/17/18  4:06 AM  Result Value Ref Range   Glucose-Capillary 231 (H) 70 - 99 mg/dL  Basic metabolic panel     Status: Abnormal   Collection Time: 12/17/18  4:09 AM  Result Value Ref Range   Sodium 142 135 - 145 mmol/L   Potassium 3.3 (L) 3.5 - 5.1 mmol/L   Chloride 105 98 - 111 mmol/L   CO2 27 22 - 32 mmol/L   Glucose, Bld 254 (H) 70 - 99 mg/dL   BUN 33 (H) 8 - 23 mg/dL   Creatinine, Ser 0.84 0.44 - 1.00 mg/dL   Calcium 8.7 (L) 8.9 - 10.3 mg/dL   GFR calc non Af Amer >60 >60 mL/min   GFR calc Af Amer >60 >60 mL/min   Anion gap 10 5 - 15    Comment: Performed at [facility], [place] [address], [place], [place] [address]  CBC     Status: Abnormal   Collection Time: 12/17/18  4:09 AM  Result Value Ref Range   WBC 8.1 4.0 - 10.5 K/uL   RBC 4.04 3.87 - 5.11 MIL/uL   Hemoglobin 10.5 (L) 12.0 - 15.0 g/dL   HCT 34.6 (L) 36.0 - 46.0 %   MCV 85.6 80.0 - 100.0 fL   MCH 26.0 26.0 - 34.0 pg   MCHC 30.3 30.0 - 36.0 g/dL   RDW 15.5 11.5 - 15.5 %   Platelets 168 150 - 400 K/uL   nRBC 0.4 (H) 0.0 - 0.2 %    Comment: Performed at [facility], [place] [address] [place], [place] [address]    Recent Results (from the past 240 hour(s))  Blood culture (routine x 2)     Status: Abnormal   Collection Time: 12/01/2018  8:43 AM  Result Value Ref Range Status   Specimen Description BLOOD LEFT ANTECUBITAL  Final   Special Requests   Final    BOTTLES DRAWN AEROBIC ONLY Blood Culture adequate volume   Culture  Setup Time   Final    GRAM POSITIVE COCCI AEROBIC BOTTLE  ONLY CRITICAL RESULT CALLED TO, READ BACK BY AND VERIFIED WITH: PHARMD G ABBOTT [id number] AT 11 AM BY CM    Culture (A)  Final    STAPHYLOCOCCUS SPECIES (COAGULASE NEGATIVE) THE SIGNIFICANCE OF ISOLATING THIS ORGANISM FROM A SINGLE SET OF BLOOD CULTURES WHEN MULTIPLE SETS ARE DRAWN IS UNCERTAIN. PLEASE NOTIFY THE MICROBIOLOGY DEPARTMENT WITHIN ONE WEEK IF SPECIATION AND SENSITIVITIES ARE REQUIRED. Performed at [facility], [place] [address], [place], [place] [address]    Report Status 12/16/2018 FINAL  Final  Blood Culture ID Panel (Reflexed)     Status: Abnormal   Collection Time: 12/02/2018  8:43 AM  Result Value Ref Range Status   Enterococcus species NOT DETECTED NOT DETECTED Final   Listeria monocytogenes NOT DETECTED NOT DETECTED Final   Staphylococcus species DETECTED (A) NOT DETECTED Final    Comment: Methicillin (oxacillin) resistant coagulase negative staphylococcus. Possible blood culture contaminant (unless isolated from more than one blood culture draw or clinical case suggests pathogenicity). No antibiotic treatment is indicated for blood  culture contaminants. CRITICAL RESULT CALLED TO, READ BACK BY AND VERIFIED WITH: PHARMD [id number] AT 654 AM BY CM    Staphylococcus aureus (BCID) NOT DETECTED NOT DETECTED Final   Methicillin resistance DETECTED (A) NOT DETECTED Final    Comment: CRITICAL  RESULT CALLED TO, READ BACK BY AND VERIFIED WITH: PHARMD [id number] AT 354 AM BY CM    Streptococcus species NOT DETECTED NOT DETECTED Final   Streptococcus agalactiae NOT DETECTED NOT DETECTED Final   Streptococcus pneumoniae NOT DETECTED NOT DETECTED Final   Streptococcus pyogenes NOT DETECTED NOT DETECTED Final   Acinetobacter baumannii NOT DETECTED NOT DETECTED Final   Enterobacteriaceae species NOT DETECTED NOT DETECTED Final   Enterobacter cloacae complex NOT DETECTED NOT DETECTED Final   Escherichia coli NOT DETECTED NOT DETECTED Final   Klebsiella oxytoca NOT DETECTED NOT DETECTED  Final   Klebsiella pneumoniae NOT DETECTED NOT DETECTED Final   Proteus species NOT DETECTED NOT DETECTED Final   Serratia marcescens NOT DETECTED NOT DETECTED Final   Haemophilus influenzae NOT DETECTED NOT DETECTED Final   Neisseria meningitidis NOT DETECTED NOT DETECTED Final   Pseudomonas aeruginosa NOT DETECTED NOT DETECTED Final   Candida albicans NOT DETECTED NOT DETECTED Final   Candida glabrata NOT DETECTED NOT DETECTED Final   Candida krusei NOT DETECTED NOT DETECTED Final   Candida parapsilosis NOT DETECTED NOT DETECTED Final   Candida tropicalis NOT DETECTED NOT DETECTED Final    Comment: Performed at [facility], [place] [address], [place], [place] [address]  Urine culture     Status: Abnormal   Collection Time: 12/04/2018  9:28 AM  Result Value Ref Range Status   Specimen Description URINE, CLEAN CATCH  Final   Special Requests   Final    NONE Performed at [facility], [place] [address], [place], [place] [address]    Culture 80,000 COLONIES/mL KLEBSIELLA PNEUMONIAE (A)  Final   Report Status 12/15/2018 FINAL  Final   Organism ID, Bacteria KLEBSIELLA PNEUMONIAE (A)  Final      Susceptibility   Klebsiella pneumoniae - MIC*    AMPICILLIN >=32 RESISTANT Resistant     CEFAZOLIN 16 SENSITIVE Sensitive     CEFTRIAXONE <=1 SENSITIVE Sensitive     CIPROFLOXACIN <=0.25 SENSITIVE Sensitive     GENTAMICIN <=1 SENSITIVE Sensitive     IMIPENEM <=0.25 SENSITIVE Sensitive     NITROFURANTOIN 64 INTERMEDIATE Intermediate     TRIMETH/SULFA >=320 RESISTANT Resistant     AMPICILLIN/SULBACTAM >=32 RESISTANT Resistant     PIP/TAZO <=4 SENSITIVE Sensitive     Extended ESBL NEGATIVE Sensitive     * 80,000 COLONIES/mL KLEBSIELLA PNEUMONIAE  Blood culture (routine x 2)     Status: None (Preliminary result)   Collection Time: 12/12/2018 10:59 AM  Result Value Ref Range Status   Specimen Description BLOOD RIGHT ARM  Final   Special Requests   Final    BOTTLES DRAWN AEROBIC AND  ANAEROBIC Blood Culture adequate volume   Culture   Final    NO GROWTH 3 DAYS Performed at [facility], [address], [place], [place] [address]    Report Status PENDING  Incomplete  MRSA PCR Screening     Status: None   Collection Time: 11/26/2018  4:25 PM  Result Value Ref Range Status   MRSA by PCR NEGATIVE NEGATIVE Final    Comment:        The GeneXpert MRSA Assay (FDA approved for NASAL specimens only), is one component of a comprehensive MRSA colonization surveillance program. It is not intended to diagnose MRSA infection nor to guide or monitor treatment for MRSA infections. Performed at [facility], [place] [address], [place], [place] [address]     Lipid Panel No results  for input(s): CHOL, TRIG, HDL, CHOLHDL, VLDL, LDLCALC in the last 72 hours.  Studies/Results: Mr Brain Wo Contrast  Result Date: 12/16/2018 CLINICAL DATA:  Anoxic brain injury following cardiac arrest. EXAM: MRI HEAD WITHOUT CONTRAST TECHNIQUE: Multiplanar, multiecho pulse sequences of the brain and surrounding structures were obtained without intravenous contrast. COMPARISON:  Head CT 12/12/2018 FINDINGS: BRAIN: There is abnormal diffusion restriction within the left PCA territory cortex, posterior left parietal lobe, right greater than left basal ganglia and both medial temporal lobes, as well as throughout the cerebellum. The midline structures are normal. No midline shift or other mass effect. Multifocal white matter hyperintensity, most commonly due to chronic ischemic microangiopathy. The cerebral and cerebellar volume are age-appropriate. No hydrocephalus. Susceptibility-sensitive sequences show no chronic microhemorrhage or superficial siderosis. No mass lesion. VASCULAR: The major intracranial arterial and venous sinus flow voids are normal. SKULL AND UPPER CERVICAL SPINE: Calvarial bone marrow signal is normal. There is no skull base mass. Visualized upper cervical spine and soft tissues are  normal. SINUSES/ORBITS: No fluid levels or advanced mucosal thickening. No mastoid or middle ear effusion. The orbits are normal. IMPRESSION: 1. Multifocal acute ischemia, greatest within the cortex of the left PCA distribution and within the cerebellum. Other areas of ischemia include the left parietal lobe and both basal ganglia. The pattern is consistent with hypoxic ischemic injury. 2. No hemorrhage or mass effect. Electronically Signed   By: Ulyses Jarred M.D.   On: 12/16/2018 17:50    Medications:  Scheduled: . aspirin  81 mg Per Tube Daily  . atorvastatin  40 mg Per Tube Daily  . chlorhexidine gluconate (MEDLINE KIT)  15 mL Mouth Rinse BID  . Chlorhexidine Gluconate Cloth  6 each Topical Daily  . clopidogrel  75 mg Per Tube Daily  . docusate  100 mg Per Tube Daily  . famotidine  20 mg Per Tube BID  . furosemide  40 mg Intravenous Daily  . heparin  5,000 Units Subcutaneous Q8H  . insulin aspart  0-20 Units Subcutaneous Q4H  . insulin detemir  30 Units Subcutaneous BID  . mouth rinse  15 mL Mouth Rinse 10 times per day  . metoprolol tartrate  12.5 mg Per Tube Q8H  . potassium chloride  20 mEq Per Tube Q4H   Continuous: . feeding supplement (VITAL HIGH PROTEIN) 1,000 mL (12/16/18 1757)  . fentaNYL infusion INTRAVENOUS 150 mcg/hr (12/17/18 0600)  . piperacillin-tazobactam (ZOSYN)  IV 12.5 mL/hr at 12/17/18 0600   MRI brain: 1. Multifocal acute ischemia, greatest within the cortex of the left PCA distribution and within the cerebellum. Other areas of ischemia include the left parietal lobe and both basal ganglia. The pattern is consistent with hypoxic ischemic injury. 2. No hemorrhage or mass effect.  EEG 3/29: Abnormal secondary to a slow discontinuous background.   Assessment:53 year oldfemalewith anoxic brain injury following cardiac arrest   1. Today's exam is again most consistent with a vegetative to minimally conscious state with minimal higher cortical function.   2. 96 hours have elapsed since the cardiac arrest.  3. Now with new onset seizures 4. MRI reveals multifocal acute ischemia, greatest within the cortex of the left PCA distribution and within the cerebellum. Other areas of ischemia include the left parietal lobe and both basal ganglia. The pattern is consistent with hypoxic ischemic injury. 5. Based on clinical, EEG and MRI findings, prognosis for a meaningful neurological recovery is dismal.   Recommendations: - Load Keppra 2000 mg then continue scheduled dosing at 1000  mg BID. Call neurology if seizures persist after Keppra load.  -Family considering discontinuation of care, but wish to see the patient in person tomorrow prior to making a final decision.   35 minutes spent in the neurological evaluation and management of this critically ill patient. Time spent included telephone discussion with the patient's son, myself and Dr. Halford Chessman.    LOS: 4 days   '@Electronically'  signed: Dr. Kerney Elbe 12/17/2018  7:11 AM

## 2018-12-17 DEATH — deceased

## 2018-12-18 LAB — GLUCOSE, CAPILLARY: Glucose-Capillary: 262 mg/dL — ABNORMAL HIGH (ref 70–99)

## 2018-12-18 LAB — CULTURE, BLOOD (ROUTINE X 2)
Culture: NO GROWTH
Special Requests: ADEQUATE

## 2018-12-18 NOTE — Significant Event (Signed)
Called patient's son Onalee Hua this morning for updates and plans for family visitation. Per Onalee Hua, he is unable to see patient today but instead, will come tomorrow to say good-bye prior to having patient be transition  to comfort care. He is unsure about the rest of his family members, as they have expressed to him that they are hesitant to come visit with patient due to current issues with Covid-19. Updated this to team.   Wallis Bamberg

## 2018-12-18 NOTE — Progress Notes (Addendum)
NAME:  Nicole Lee, MRN:  [id number], DOB:  [birth year], LOS: 5 ADMISSION DATE:  12/28/2018, CONSULTATION DATE:  12/28/18 REFERRING MD:  EDP Dr. Dalene Seltzer  , CHIEF COMPLAINT:  Cardiac Arrest    Brief History   63 yo female former smoker with PEA/VF arrest with ROSC in 10 min.  Blood sugar was > 1000, hyperkalemia.  Past Medical History  Diabetes, hyperlipidemia, COPD, coronary artery disease  Significant Hospital Events   3/28 Admit, cardiology consulted 3/29 Neuro consulted 4/01 Focal seizure activity; DNR, no escalation of care  Consults:  3/28 cardiology 3/29 neuro   Procedures:  3/28 ETT >>   Significant Diagnostic Tests:  3/28 echo>>EF 20-25%, degenerative MV , LV global hypokinesis  3/29 EEG >> background slowing 3/31 MRI brain >> multifocal acute ischemia from hypoxic injury  Micro Data:  Blood 3/28 >> Coag neg Staph Urine 3/28 >> Klebsiella  Antimicrobials:  3/28 vancomycin >> 3/30 3/28 Zosyn >>   Interim history/subjective:  Still not responsive, roving eye movements continue.  Objective   Blood pressure 136/71, pulse (!) 109, temperature 98.8 F (37.1 C), temperature source Axillary, resp. rate (!) 23, height 5\' 1"  (1.549 m), weight 98.2 kg, SpO2 95 %.    Vent Mode: PRVC FiO2 (%):  [40 %] 40 % Set Rate:  [22 bmp] 22 bmp Vt Set:  [420 mL] 420 mL PEEP:  [5 cmH20] 5 cmH20 Pressure Support:  [10 cmH20] 10 cmH20 Plateau Pressure:  [17 cmH20-19 cmH20] 17 cmH20   Intake/Output Summary (Last 24 hours) at 12/18/2018 0740 Last data filed at 12/18/2018 0600 Gross per 24 hour  Intake 1028.19 ml  Output 1030 ml  Net -1.81 ml   Filed Weights   12/16/18 0421 12/17/18 0413 12/18/18 0326  Weight: 96.4 kg 95.4 kg 98.2 kg    Examination:  General - unresponsive Eyes - roving eye movements ENT - ETT in place Cardiac - regular rate/rhythm, no murmur Chest - scattered rhochi Abdomen - soft, non tender, + bowel sounds Extremities - no cyanosis,  clubbing, or edema Skin - no rashes Neuro - +corneal reflexes, exaggerated bil + babinskis, otherwise not responsive to noxious stimuli  Resolved Hospital Problem list   Metabolic acidosis, lactic acidosis, DKA, AKI with ATN, Elevated LFTs from shock  Assessment:   Acute hypoxic respiratory failure from cardiac arrest. Aspiration pneumonia. Hx of COPD. Fever. Klebsiella UTI. PEA/VF cardiac arrest. NSTEMI. Acute systolic CHF. Hx of CAD, HLD. DM. Acute anoxic encephalopathy. Focal seizures. Vegetative state. Hypokalemia.  Plan:  - DNR - No escalation of care - RN spoke with family, will come tomorrow for terminal extubation  Best practice:  Diet: tube feeds DVT prophylaxis: SQ heparin GI prophylaxis: Pepcid Mobility: Bedrest Code Status: spoke with pt's son over the phone.  Explained MRI and clinic findings in conjunction with Dr. Otelia Limes.  Family would not want aggressive care with no realistic hope of meaningful recovery.  DNR, no escalation of care.  Labs    CMP Latest Ref Rng & Units 12/17/2018 12/16/2018 12/15/2018  Glucose 70 - 99 mg/dL 778(E) 423(N) 361(W)  BUN 8 - 23 mg/dL 43(X) 54(M) 19  Creatinine 0.44 - 1.00 mg/dL 0.86 7.61 9.50  Sodium 135 - 145 mmol/L 142 138 135  Potassium 3.5 - 5.1 mmol/L 3.3(L) 3.5 4.6  Chloride 98 - 111 mmol/L 105 104 104  CO2 22 - 32 mmol/L 27 25 23   Calcium 8.9 - 10.3 mg/dL 9.3(O) 8.1(L) 8.0(L)  Total Protein 6.5 - 8.1 g/dL - - -  Total Bilirubin 0.3 - 1.2 mg/dL - - -  Alkaline Phos 38 - 126 U/L - - -  AST 15 - 41 U/L - - -  ALT 0 - 44 U/L - - -   CBC Latest Ref Rng & Units 12/17/2018 12/16/2018 12/15/2018  WBC 4.0 - 10.5 K/uL 8.1 7.9 8.3  Hemoglobin 12.0 - 15.0 g/dL 10.5(L) 10.2(L) 10.1(L)  Hematocrit 36.0 - 46.0 % 34.6(L) 33.7(L) 33.2(L)  Platelets 150 - 400 K/uL 168 150 155   ABG    Component Value Date/Time   PHART 7.320 (L) 12/14/2018 0454   PCO2ART 44.1 12/14/2018 0454   PO2ART 79.0 (L) 12/14/2018 0454   HCO3 22.6  12/14/2018 0454   TCO2 24 12/14/2018 0454   ACIDBASEDEF 3.0 (H) 12/14/2018 0454   O2SAT 94.0 12/14/2018 0454   CBG (last 3)  Recent Labs    12/17/18 0027 12/17/18 0406 12/17/18 0739  GLUCAP 312* 231* 255*   Nyra Market, MD [facility] - PGY3 Pager [phone] 12/18/2018, 7:40 AM

## 2018-12-19 LAB — GLUCOSE, CAPILLARY: Glucose-Capillary: 244 mg/dL — ABNORMAL HIGH (ref 70–99)

## 2018-12-19 MED ORDER — MORPHINE SULFATE (PF) 2 MG/ML IV SOLN
2.0000 mg | INTRAVENOUS | Status: DC | PRN
  Administered 2018-12-19: 14:00:00 2 mg via INTRAVENOUS
  Filled 2018-12-19: qty 1

## 2018-12-19 MED ORDER — GLYCOPYRROLATE 1 MG PO TABS
1.0000 mg | ORAL_TABLET | ORAL | Status: DC | PRN
  Filled 2018-12-19: qty 1

## 2018-12-19 MED ORDER — MORPHINE 100MG IN NS 100ML (1MG/ML) PREMIX INFUSION
0.0000 mg/h | INTRAVENOUS | Status: DC
  Administered 2018-12-19: 5 mg/h via INTRAVENOUS
  Administered 2018-12-19 – 2018-12-20 (×2): 20 mg/h via INTRAVENOUS
  Filled 2018-12-19 (×3): qty 100

## 2018-12-19 MED ORDER — DIPHENHYDRAMINE HCL 50 MG/ML IJ SOLN: 25.0000 mg | INTRAMUSCULAR | Status: DC | PRN

## 2018-12-19 MED ORDER — MORPHINE BOLUS VIA INFUSION
5.0000 mg | INTRAVENOUS | Status: DC | PRN
  Filled 2018-12-19: qty 5

## 2018-12-19 MED ORDER — ACETAMINOPHEN 325 MG PO TABS: 650.0000 mg | ORAL_TABLET | Freq: Four times a day (QID) | ORAL | Status: DC | PRN

## 2018-12-19 MED ORDER — POLYVINYL ALCOHOL 1.4 % OP SOLN
1.0000 [drp] | Freq: Four times a day (QID) | OPHTHALMIC | Status: DC | PRN
  Filled 2018-12-19: qty 15

## 2018-12-19 MED ORDER — GLYCOPYRROLATE 0.2 MG/ML IJ SOLN: 0.2000 mg | INTRAMUSCULAR | Status: DC | PRN

## 2018-12-19 MED ORDER — FENTANYL CITRATE (PF) 100 MCG/2ML IJ SOLN
25.0000 ug | INTRAMUSCULAR | Status: DC | PRN
  Administered 2018-12-19 (×2): 100 ug via INTRAVENOUS
  Filled 2018-12-19 (×2): qty 2

## 2018-12-19 MED ORDER — DEXTROSE 5 % IV SOLN: INTRAVENOUS | Status: DC

## 2018-12-19 MED ORDER — ACETAMINOPHEN 650 MG RE SUPP: 650.0000 mg | Freq: Four times a day (QID) | RECTAL | Status: DC | PRN

## 2018-12-19 NOTE — Progress Notes (Signed)
NAME:  Nicole Lee, MRN:  [id number], DOB:  02-Jul-1956, LOS: 6 ADMISSION DATE:  11/25/2018, CONSULTATION DATE: 12/12/2018 REFERRING MD: [facility], CHIEF COMPLAINT: Cardiac arrest  HPI/course in hospital  63 year old woman with VF/PEA arrest in the context of hyperglycemic hyperkalemic episode. Myoclonic seizures noted.  Patchy infarction seen on MRI consistent with hypoxia Goals of care discussion.  Plan for compassionate extubation 4/3.  Past Medical History   Past Medical History:  Diagnosis Date  . Cardiac arrest (HCC) 12/05/2018  . Chronic systolic CHF (congestive heart failure) (HCC) 09/2017   EF 25-30% by echo at [facility], Cardiologist Dr Tollie Pizza  . COPD (chronic obstructive pulmonary disease) (HCC)   . Depression   . Fluid retention   . GERD (gastroesophageal reflux disease)   . Heart attack (HCC)    x7  . Hyperlipidemia   . Hypertension   . Insulin dependent diabetes mellitus (HCC)   . Neuropathy   . Vertigo      Past Surgical History:  Procedure Laterality Date  . Cardiac Stents     x11      Interim history/subjective:  No change in neurological status.  Discussed situation with son who stated that he is coming in today prior to withdrawal of care.  Objective   Blood pressure (!) 153/74, pulse 89, temperature 97.6 F (36.4 C), temperature source Oral, resp. rate (!) 22, height 5\' 1"  (1.549 m), weight 93.3 kg, SpO2 92 %.    Vent Mode: PRVC FiO2 (%):  [40 %] 40 % Set Rate:  [22 bmp] 22 bmp Vt Set:  [420 mL] 420 mL PEEP:  [5 cmH20] 5 cmH20 Plateau Pressure:  [16 cmH20-20 cmH20] 16 cmH20   Intake/Output Summary (Last 24 hours) at 12/19/2018 1441 Last data filed at 12/19/2018 1157 Gross per 24 hour  Intake 421.49 ml  Output 1005 ml  Net -583.51 ml   Filed Weights   12/17/18 0413 12/18/18 0326 12/19/18 0500  Weight: 95.4 kg 98.2 kg 93.3 kg    Examination: Physical Exam Constitutional:      Comments: Unresponsive.  On no  sedation.  HENT:     Head: Normocephalic and atraumatic.     Mouth/Throat:     Comments: OG tube ET tube in place Eyes:     Conjunctiva/sclera: Conjunctivae normal.  Cardiovascular:     Rate and Rhythm: Normal rate and regular rhythm.     Heart sounds: Normal heart sounds.  Pulmonary:     Breath sounds: Normal breath sounds.     Comments: Spontaneously overbreathing the ventilator Abdominal:     Palpations: Abdomen is soft.  Skin:    Capillary Refill: Capillary refill takes less than 2 seconds.  Neurological:     Comments: Roving eye movements.  Strong cough.  Excellent response to painful stimulation.      Ancillary tests (personally reviewed)  CBC: Recent Labs  Lab 12/12/2018 0843  11/19/2018 1621 12/14/18 0403 12/14/18 0454 12/15/18 0506 12/16/18 0334 12/17/18 0409  WBC 23.8*  --  13.6* 9.3  --  8.3 7.9 8.1  NEUTROABS 17.7*  --  11.9*  --   --   --   --   --   HGB 12.3   < > 11.5* 10.9* 10.9* 10.1* 10.2* 10.5*  HCT 45.6   < > 38.1 35.4* 32.0* 33.2* 33.7* 34.6*  MCV 92.9  --  83.6 82.7  --  84.9 85.5 85.6  PLT 376  --  191 187  --  155  150 168   < > = values in this interval not displayed.    Basic Metabolic Panel: Recent Labs  Lab 11/17/2018 0843  12/11/2018 1539  12/14/18 0155 12/14/18 0403 12/14/18 0454 12/14/18 1638 12/15/18 0506 12/16/18 0334 12/17/18 0409  NA 115*   < >  --    < > 135 136 138  --  135 138 142  K 7.3*   < >  --    < > 3.9 4.0 4.0  --  4.6 3.5 3.3*  CL 80*  --   --    < > 105 108  --   --  104 104 105  CO2 13*  --   --    < > 21* 22  --   --  23 25 27   GLUCOSE 1,112*  --   --    < > 163* 153*  --   --  313* 282* 254*  BUN 18  --   --    < > 13 12  --   --  19 24* 33*  CREATININE 1.78*  --   --    < > 0.68 0.70  --   --  0.77 0.69 0.84  CALCIUM 8.6*  --   --    < > 7.6* 7.5*  --   --  8.0* 8.1* 8.7*  MG 2.0  --  0.5*  --   --  1.9  --  2.5* 2.2  --   --   PHOS 10.7*  --  3.1  --   --  2.8  --  2.5 1.7*  --   --    < > = values in this  interval not displayed.   GFR: Estimated Creatinine Clearance: 70.5 mL/min (by C-G formula based on SCr of 0.84 mg/dL). Recent Labs  Lab 11/30/2018 0843 12/03/2018 1104 11/24/2018 1539  12/14/18 0403 12/14/18 1151 12/15/18 0506 12/16/18 0334 12/17/18 0409  PROCALCITON <0.10  --   --   --   --   --   --   --   --   WBC 23.8*  --   --    < > 9.3  --  8.3 7.9 8.1  LATICACIDVEN  --  6.9* 2.5*  --   --  1.1  --   --   --    < > = values in this interval not displayed.    Liver Function Tests: Recent Labs  Lab 11/23/2018 0843 11/25/2018 2103 12/14/18 0403  AST 71* 76* 66*  ALT 55* 37 36  ALKPHOS 127* 70 66  BILITOT 0.8 0.6 0.5  PROT 7.2 5.7* 5.7*  ALBUMIN 2.9* 2.3* 2.3*   No results for input(s): LIPASE, AMYLASE in the last 168 hours. No results for input(s): AMMONIA in the last 168 hours.  ABG    Component Value Date/Time   PHART 7.320 (L) 12/14/2018 0454   PCO2ART 44.1 12/14/2018 0454   PO2ART 79.0 (L) 12/14/2018 0454   HCO3 22.6 12/14/2018 0454   TCO2 24 12/14/2018 0454   ACIDBASEDEF 3.0 (H) 12/14/2018 0454   O2SAT 94.0 12/14/2018 0454     Coagulation Profile: Recent Labs  Lab 12/15/2018 0843  INR 1.3*    Cardiac Enzymes: Recent Labs  Lab 12/16/2018 2103 12/14/18 0155 12/14/18 0731 12/14/18 1302 12/14/18 2007  TROPONINI 4.86* 5.19* 3.98* 2.94* 2.29*    HbA1C: Hgb A1c MFr Bld  Date/Time Value Ref Range Status  12/04/2018 08:43 AM 12.5 (H) 4.8 - 5.6 %  Final    Comment:    (NOTE) Pre diabetes:          5.7%-6.4% Diabetes:              >6.4% Glycemic control for   <7.0% adults with diabetes     CBG: Recent Labs  Lab 12/17/18 0027 12/17/18 0406 12/17/18 0739 12/18/18 0821 12/18/18 1136  GLUCAP 312* 231* 255* 262* 244*     Assessment & Plan:  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 MRI evidence of ischemic hypoxic encephalopathy No improvement in neurological status over 96 hours  Plan  For transition to comfort care  and compassionate extubation today.   Critical care time: N/A    Lynnell Catalan, MD [facility] ICU Physician [facility]  Pager: [phone] Mobile: [phone] After hours: [phone].  12/19/2018, 2:41 PM

## 2018-12-19 NOTE — Procedures (Signed)
Extubation Procedure Note  Patient Details:   Name: Nicole Lee DOB: 1956/03/05 MRN: [id number]   Airway Documentation:    Vent end date: 12/19/18 Vent end time: 1350   Evaluation  O2 sats: currently acceptable Complications: No apparent complications Patient did tolerate procedure well. Bilateral Breath Sounds: Diminished   No  Toula Moos 12/19/2018, 1:53 PM

## 2018-12-19 NOTE — Progress Notes (Signed)
Nutrition Brief Note  Chart reviewed. Plan is for one-way extubation and transition to comfort care today. Spoke with RN.  No further nutrition interventions warranted at this time. RD d/c tube feeding orders. Please re-consult as needed.    Earma Reading, MS, RD, LDN Inpatient Clinical Dietitian Pager: [phone] Weekend/After Hours: [phone]

## 2018-12-23 ENCOUNTER — Telehealth: Payer: Self-pay

## 2018-12-23 NOTE — Telephone Encounter (Signed)
On 12/23/2018 I received dc from [facility] (faxed)  DC is for cremation. Patient is a patient of Doctor Agarwala.  DC will be taken to [facility] for signature.

## 2018-12-29 ENCOUNTER — Telehealth: Payer: Self-pay | Admitting: Pulmonary Disease

## 2018-12-29 NOTE — Telephone Encounter (Signed)
12/29/18 received signed d/c back from Dr. Denese Killings called [facility] to pick up. PWR

## 2019-01-06 NOTE — Telephone Encounter (Signed)
01/06/19 Received original D/C from [employer] called Dr.Agarwala to sign since he signed the copy Have not head back from him. PWR.  01/07/19 received original back from Dr. Denese Killings will mail it to [facility]/ PWR

## 2019-01-16 NOTE — Progress Notes (Signed)
55 ml of morphine drip wasted in stericycle. Witnessed by Loistine Simas, RN

## 2019-01-16 NOTE — Death Summary Note (Signed)
Nicole Lee was a 63 y.o. female admitted on 12/04/2018 presented with with cardiac arrest with PEA and ventricular fibrillation.  Had ROSC after 10 minutes.  Found to have hyperkalemia and blood sugar > 1000.  Required intubation.  Seen by neurology and cardiology.  Concern for anoxic encephalopathy and aspiration pneumonia.  Treated with antibiotics.  Family opted for DNR status.  Poor neuro prognosis.  Family opted for transition to comfort measures.  She was extubated on 4/03.  She expired on 2019-01-19 at 5:51.  Final diagnoses: Acute hypoxic respiratory failure from cardiac arrest. Aspiration pneumonia. Hx of COPD. Fever. Klebsiella UTI. PEA/VF cardiac arrest. NSTEMI. Acute systolic CHF. Hx of CAD, HLD. DM. Acute anoxic encephalopathy. Focal seizures. Vegetative state. Hypokalemia.   Coralyn Helling, MD [facility] 12/24/2018, 2:35 PM

## 2019-01-16 NOTE — Progress Notes (Signed)
Pt went asystole at 05:51 on Jan 10, 2019 with primary RN at bedside. Confirmed by auscultation by this RN and primary RN Waynetta Sandy. Strip printed and placed in pt chart. [facility] notified of TOD. [facility] notified, not a donation candidate d/t bacteremia.   Primary RN to notify family shortly.

## 2019-01-16 DEATH — deceased

## 2020-02-09 IMAGING — MR MRI HEAD WITHOUT CONTRAST
9 of 11 series · 33 of 48 positions shown · non-contrast
Comparison: Head CT 12/13/2018

CLINICAL DATA: Anoxic brain injury following cardiac arrest.

EXAM:
MRI HEAD WITHOUT CONTRAST
TECHNIQUE: Multiplanar, multiecho pulse sequences of the brain and surrounding
structures were obtained without intravenous contrast.

[Series 3: DWI · axial · 3.0mm · 0.94mm/px · z∈[-26,+111]mm · 8 of 94 slices shown (1 of 2)]
[im 1/94]
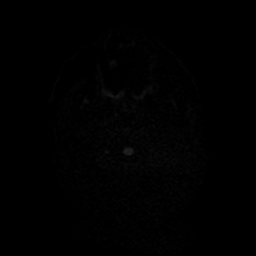
[im 14/94]
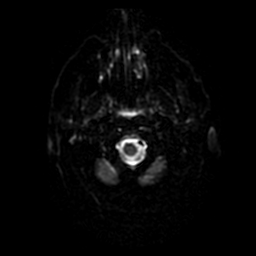
[im 27/94]
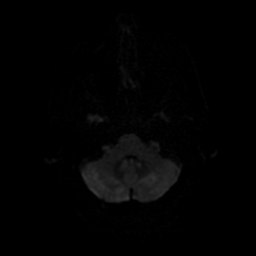
[im 40/94]
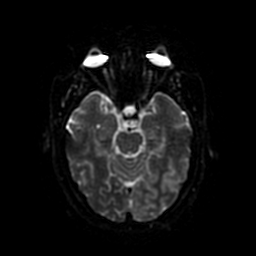
[im 54/94]
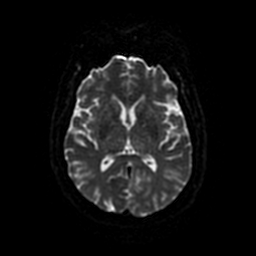
[im 67/94]
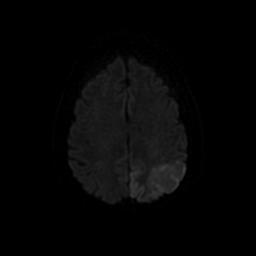
[im 80/94]
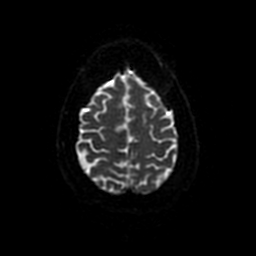
[im 94/94]
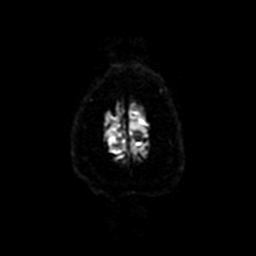

[Series 4: FLAIR · axial · 3.0mm · 0.47mm/px · z∈[-26,+111]mm · 2 of 24 slices shown (1 of 2)]
[im 1/24]
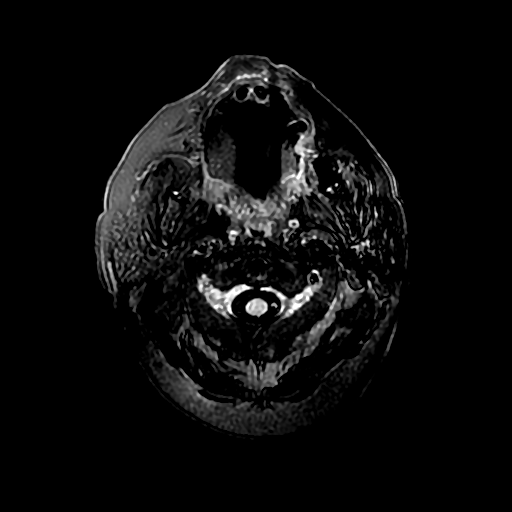
[im 24/24]
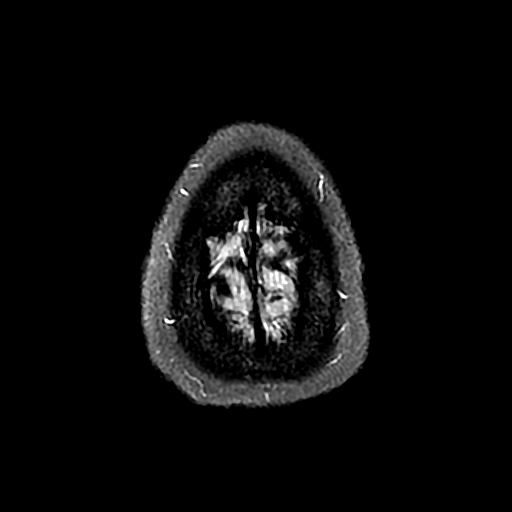

[Series 5: (person_name) · axial · 3.0mm · 0.47mm/px · z∈[-28,+53]mm · 5 of 96 slices shown]
[im 1/96]
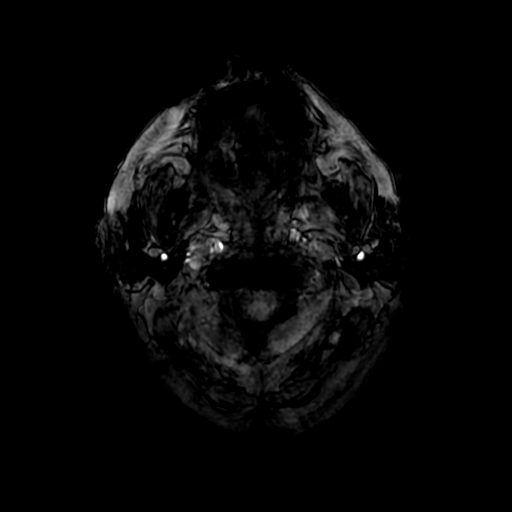
[im 14/96]
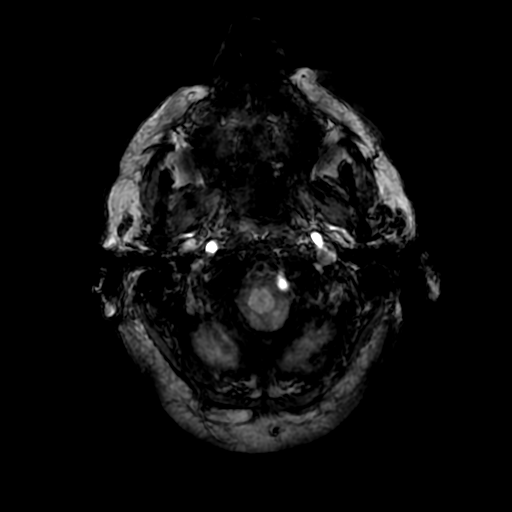
[im 28/96]
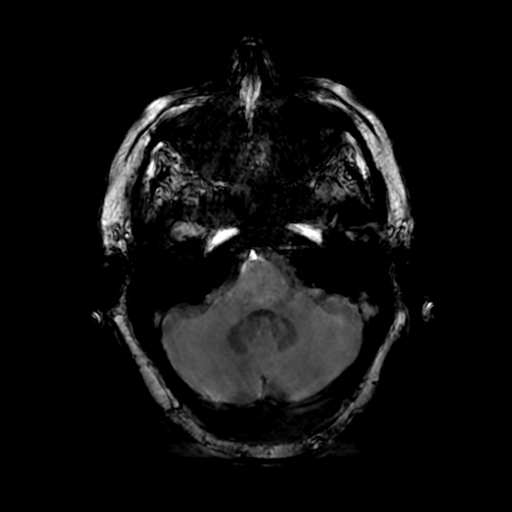
[im 41/96]
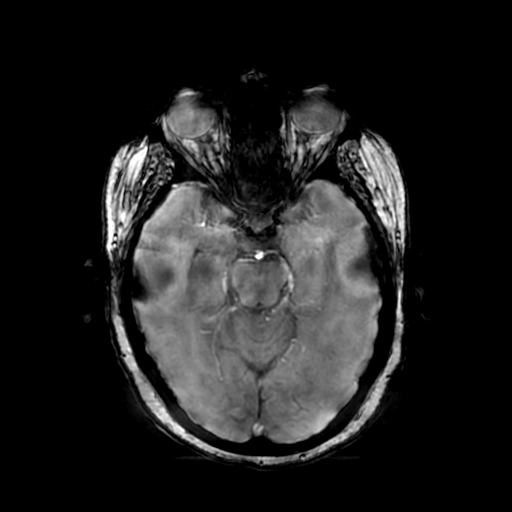
[im 55/96]
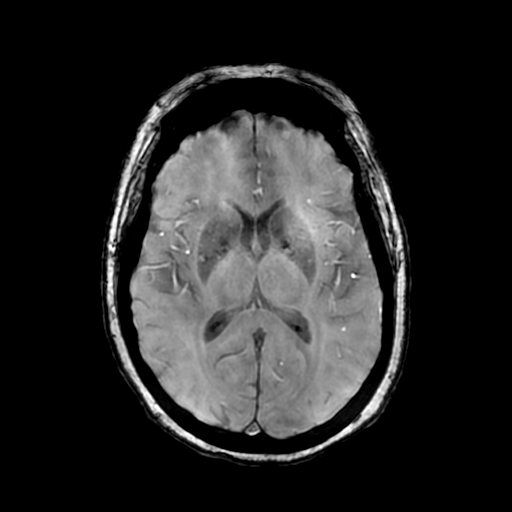

[Series 7: T2 · axial · 5.0mm · 0.47mm/px · z∈[-26,+111]mm · 2 of 24 slices shown (1 of 2)]
[im 1/24]
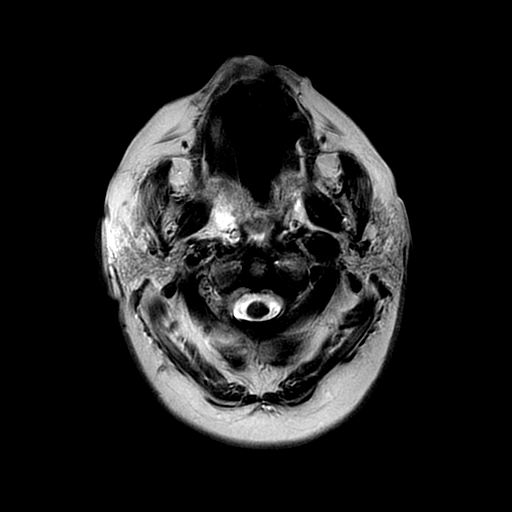
[im 24/24]
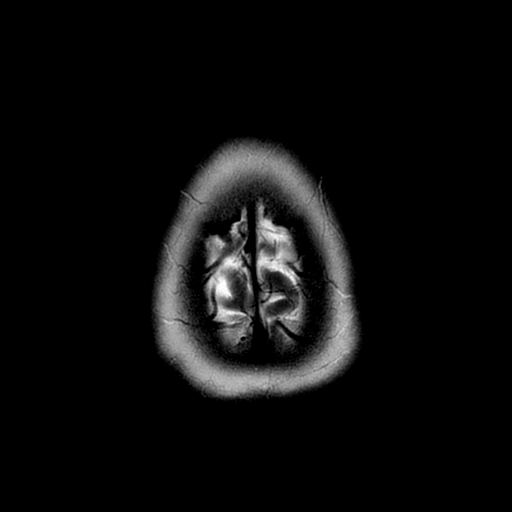

[Series 8: DWI · coronal · 4.0mm · 0.94mm/px · 5 of 64 slices shown (2 of 2)]
[im 1/64]
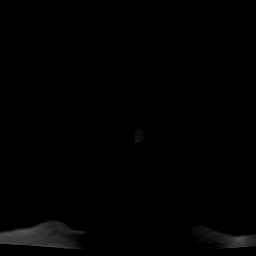
[im 16/64]
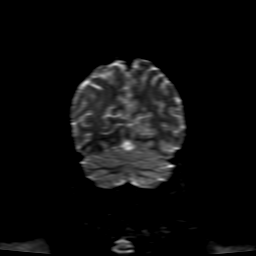
[im 32/64]
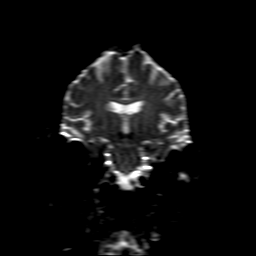
[im 48/64]
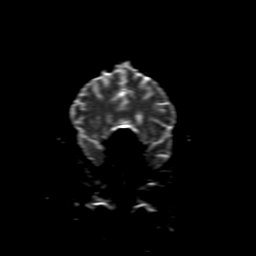
[im 64/64]
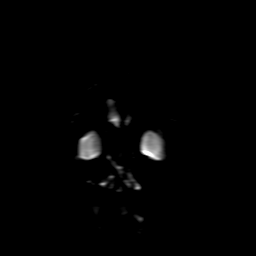

[Series 9: FLAIR · sagittal · 5.0mm · 0.47mm/px · 2 of 21 slices shown (2 of 2)]
[im 1/21]
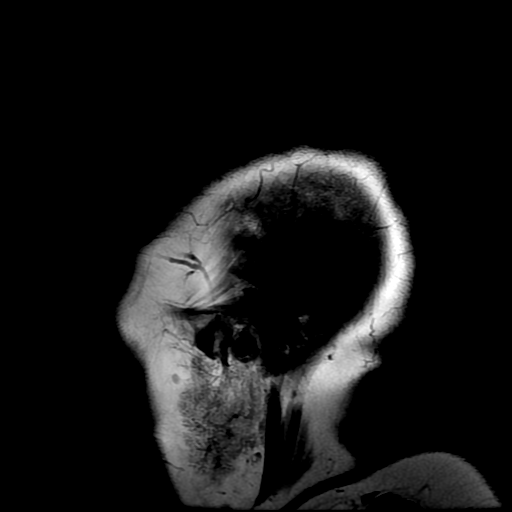
[im 21/21]
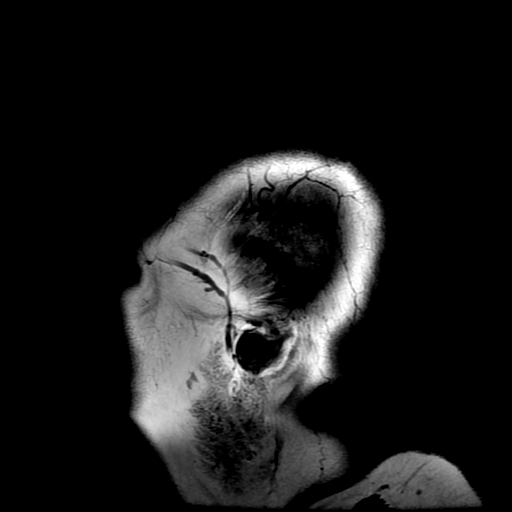

[Series 11: T2 · coronal · 5.0mm · 0.43mm/px · 2 of 27 slices shown (2 of 2)]
[im 1/27]
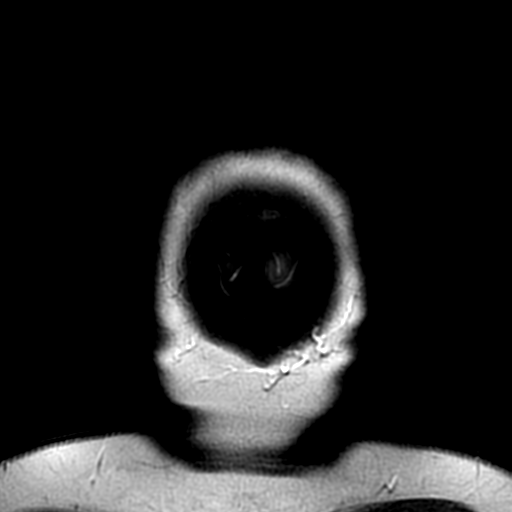
[im 27/27]
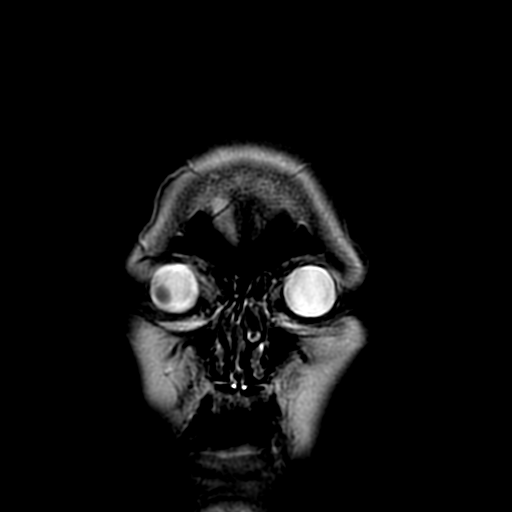

[Series 350: ADC · axial · 3.0mm · 0.94mm/px · z∈[-26,+111]mm · 4 of 47 slices shown (1 of 2)]
[im 1/47]
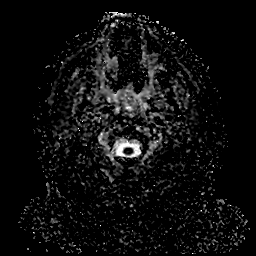
[im 16/47]
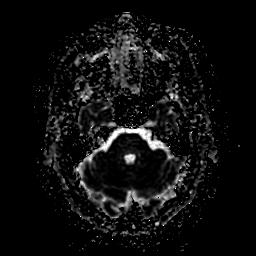
[im 31/47]
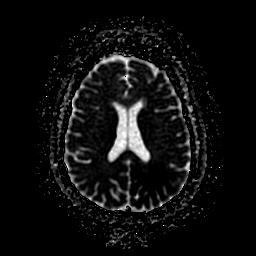
[im 47/47]
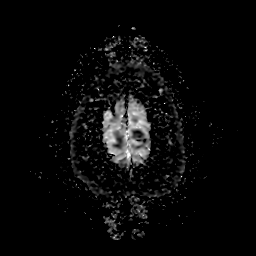

[Series 850: ADC · coronal · 4.0mm · 0.94mm/px · 3 of 32 slices shown (2 of 2)]
[im 1/32]
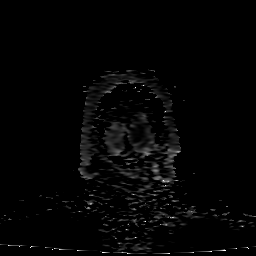
[im 16/32]
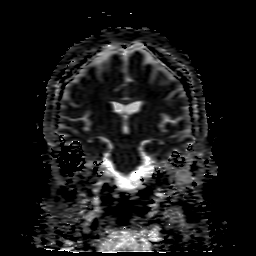
[im 32/32]
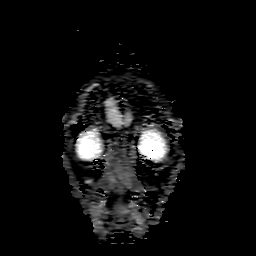

[33 of 48 positions shown; findings below may reference images not displayed]

FINDINGS: BRAIN: There is abnormal diffusion restriction within the left PCA
territory cortex, posterior left parietal lobe, right greater than
left basal ganglia and both medial temporal lobes, as well as
throughout the cerebellum. The midline structures are normal. No
midline shift or other mass effect. Multifocal white matter
hyperintensity, most commonly due to chronic ischemic
microangiopathy. The cerebral and cerebellar volume are
age-appropriate. No hydrocephalus. Susceptibility-sensitive
sequences show no chronic microhemorrhage or superficial siderosis.
No mass lesion.

VASCULAR: The major intracranial arterial and venous sinus flow
voids are normal.

SKULL AND UPPER CERVICAL SPINE: Calvarial bone marrow signal is
normal. There is no skull base mass. Visualized upper cervical spine
and soft tissues are normal.

SINUSES/ORBITS: No fluid levels or advanced mucosal thickening. No
mastoid or middle ear effusion. The orbits are normal.
IMPRESSION: 1. Multifocal acute ischemia, greatest within the cortex of the left
PCA distribution and within the cerebellum. Other areas of ischemia
include the left parietal lobe and both basal ganglia. The pattern
is consistent with hypoxic ischemic injury.
2. No hemorrhage or mass effect.
